# Patient Record
Sex: Female | Born: 1960 | Race: Black or African American | Hispanic: No | Marital: Single | Smoking: Never smoker
Health system: Southern US, Community
[De-identification: ages and names within clinical notes are randomized; demographics above are authoritative.]

## PROBLEM LIST (undated history)

## (undated) ENCOUNTER — Emergency Department (HOSPITAL_BASED_OUTPATIENT_CLINIC_OR_DEPARTMENT_OTHER): Admission: EM | Discharge status: Absent without leave | Payer: Medicare Other

## (undated) DIAGNOSIS — I10 Essential (primary) hypertension: Secondary | ICD-10-CM

## (undated) DIAGNOSIS — E119 Type 2 diabetes mellitus without complications: Secondary | ICD-10-CM

## (undated) DIAGNOSIS — F79 Unspecified intellectual disabilities: Secondary | ICD-10-CM

## (undated) HISTORY — PX: CERVICAL SPINE SURGERY: SHX589

## (undated) HISTORY — PX: ABDOMINAL HYSTERECTOMY: SHX81

## (undated) HISTORY — PX: BACK SURGERY: SHX140

---

## 2014-07-13 ENCOUNTER — Emergency Department (HOSPITAL_BASED_OUTPATIENT_CLINIC_OR_DEPARTMENT_OTHER): Payer: Medicare Other

## 2014-07-13 ENCOUNTER — Emergency Department (HOSPITAL_BASED_OUTPATIENT_CLINIC_OR_DEPARTMENT_OTHER)
Admission: EM | Admit: 2014-07-13 | Discharge: 2014-07-13 | Disposition: A | Discharge status: Home / Self Care | Payer: Medicare Other | Attending: Emergency Medicine | Admitting: Emergency Medicine

## 2014-07-13 ENCOUNTER — Encounter (HOSPITAL_BASED_OUTPATIENT_CLINIC_OR_DEPARTMENT_OTHER): Payer: Self-pay | Admitting: Emergency Medicine

## 2014-07-13 DIAGNOSIS — F79 Unspecified intellectual disabilities: Secondary | ICD-10-CM | POA: Insufficient documentation

## 2014-07-13 DIAGNOSIS — Z79899 Other long term (current) drug therapy: Secondary | ICD-10-CM | POA: Diagnosis not present

## 2014-07-13 DIAGNOSIS — M79609 Pain in unspecified limb: Secondary | ICD-10-CM | POA: Insufficient documentation

## 2014-07-13 DIAGNOSIS — M79604 Pain in right leg: Secondary | ICD-10-CM

## 2014-07-13 DIAGNOSIS — I10 Essential (primary) hypertension: Secondary | ICD-10-CM | POA: Insufficient documentation

## 2014-07-13 DIAGNOSIS — M25669 Stiffness of unspecified knee, not elsewhere classified: Secondary | ICD-10-CM | POA: Diagnosis not present

## 2014-07-13 HISTORY — DX: Unspecified intellectual disabilities: F79

## 2014-07-13 HISTORY — DX: Essential (primary) hypertension: I10

## 2014-07-13 LAB — CBC WITH DIFFERENTIAL/PLATELET
BASOS ABS: 0 10*3/uL (ref 0.0–0.1)
Basophils Relative: 1 % (ref 0–1)
EOS PCT: 1 % (ref 0–5)
Eosinophils Absolute: 0 10*3/uL (ref 0.0–0.7)
HEMATOCRIT: 39.7 % (ref 36.0–46.0)
Hemoglobin: 13.2 g/dL (ref 12.0–15.0)
LYMPHS PCT: 44 % (ref 12–46)
Lymphs Abs: 2.6 10*3/uL (ref 0.7–4.0)
MCH: 27.5 pg (ref 26.0–34.0)
MCHC: 33.2 g/dL (ref 30.0–36.0)
MCV: 82.7 fL (ref 78.0–100.0)
MONO ABS: 0.4 10*3/uL (ref 0.1–1.0)
Monocytes Relative: 7 % (ref 3–12)
Neutro Abs: 2.8 10*3/uL (ref 1.7–7.7)
Neutrophils Relative %: 47 % (ref 43–77)
Platelets: 285 10*3/uL (ref 150–400)
RBC: 4.8 MIL/uL (ref 3.87–5.11)
RDW: 13.2 % (ref 11.5–15.5)
WBC: 5.8 10*3/uL (ref 4.0–10.5)

## 2014-07-13 LAB — COMPREHENSIVE METABOLIC PANEL
ALT: 19 U/L (ref 0–35)
ANION GAP: 11 (ref 5–15)
AST: 16 U/L (ref 0–37)
Albumin: 4 g/dL (ref 3.5–5.2)
Alkaline Phosphatase: 49 U/L (ref 39–117)
BUN: 17 mg/dL (ref 6–23)
CALCIUM: 9.9 mg/dL (ref 8.4–10.5)
CO2: 29 meq/L (ref 19–32)
CREATININE: 1 mg/dL (ref 0.50–1.10)
Chloride: 103 mEq/L (ref 96–112)
GFR calc Af Amer: 73 mL/min — ABNORMAL LOW (ref >90)
GFR calc non Af Amer: 63 mL/min — ABNORMAL LOW (ref >90)
Glucose, Bld: 126 mg/dL — ABNORMAL HIGH (ref 70–99)
Potassium: 3.4 mEq/L — ABNORMAL LOW (ref 3.7–5.3)
Sodium: 143 mEq/L (ref 137–147)
Total Bilirubin: 0.7 mg/dL (ref 0.3–1.2)
Total Protein: 7.3 g/dL (ref 6.0–8.3)

## 2014-07-13 LAB — URINALYSIS, ROUTINE W REFLEX MICROSCOPIC
Bilirubin Urine: NEGATIVE
GLUCOSE, UA: NEGATIVE mg/dL
HGB URINE DIPSTICK: NEGATIVE
KETONES UR: NEGATIVE mg/dL
Leukocytes, UA: NEGATIVE
Nitrite: NEGATIVE
Protein, ur: NEGATIVE mg/dL
Specific Gravity, Urine: 1.023 (ref 1.005–1.030)
Urobilinogen, UA: 1 mg/dL (ref 0.0–1.0)
pH: 6 (ref 5.0–8.0)

## 2014-07-13 MED ORDER — DICLOFENAC SODIUM 75 MG PO TBEC: 75.0000 mg | DELAYED_RELEASE_TABLET | Freq: Two times a day (BID) | ORAL | Status: DC

## 2014-07-13 NOTE — ED Notes (Signed)
Pt returned to treatment room from bathroom with steady gait.

## 2014-07-13 NOTE — Discharge Instructions (Signed)

## 2014-07-13 NOTE — ED Notes (Signed)
Pt gaurdian (her sister) reports that the patient has had right leg pain for 1.5 months. Pain and "stiffness" has become worse

## 2014-07-13 NOTE — ED Provider Notes (Signed)
CSN: [id number]     Arrival date & time 07/13/14  1104 History   First MD Initiated Contact with Patient 07/13/14 1129     Chief Complaint  Patient presents with  . Leg Pain     (Consider location/radiation/quality/duration/timing/severity/associated sxs/prior Treatment) Patient is a 53 y.o. female presenting with leg pain. The history is provided by the patient. No language interpreter was used.  Leg Pain Location:  Leg Injury: no   Leg location:  R leg Pain details:    Quality:  Aching   Radiates to:  Does not radiate   Severity:  Moderate   Timing:  Constant Chronicity:  New Dislocation: no   Relieved by:  Nothing Worsened by:  Nothing tried Ineffective treatments:  None tried Associated symptoms: stiffness   Associated symptoms: no numbness and no swelling   Risk factors: no concern for non-accidental trauma   Pt here with sibling.   Pt has had leg pain for 6 weeks.  Pt has had decreased ability to walk.    Past Medical History  Diagnosis Date  . Hypertension   . Mental retardation    Past Surgical History  Procedure Laterality Date  . Abdominal hysterectomy     No family history on file. History  Substance Use Topics  . Smoking status: Never Smoker   . Smokeless tobacco: Not on file  . Alcohol Use: No   OB History   Grav Para Term Preterm Abortions TAB SAB Ect Mult Living                 Review of Systems  Musculoskeletal: Positive for stiffness.  All other systems reviewed and are negative.     Allergies  Review of patient's allergies indicates no known allergies.  Home Medications   Prior to Admission medications   Medication Sig Start Date End Date Taking? Authorizing Provider  ARIPiprazole (ABILIFY PO) Take by mouth.   Yes Historical Provider, MD  LORazepam (ATIVAN PO) Take by mouth.   Yes Historical Provider, MD  PARoxetine (PAXIL) 40 MG tablet Take 40 mg by mouth every morning.   Yes Historical Provider, MD   triamterene-hydrochlorothiazide (MAXZIDE-25) 37.5-25 MG per tablet Take 1 tablet by mouth daily.   Yes Historical Provider, MD   BP 137/104  Pulse 87  Temp(Src) 99.3 F (37.4 C) (Oral)  Resp 18  Ht 5\' 2"  (1.575 m)  Wt 176 lb (79.833 kg)  BMI 32.18 kg/m2  SpO2 98% Physical Exam  Nursing note and vitals reviewed. Constitutional: She is oriented to person, place, and time. She appears well-developed and well-nourished.  HENT:  Head: Normocephalic and atraumatic.  Eyes: EOM are normal. Pupils are equal, round, and reactive to light.  Neck: Normal range of motion.  Cardiovascular: Normal rate.   Pulmonary/Chest: Effort normal.  Abdominal: Soft. She exhibits no distension.  Musculoskeletal: She exhibits no edema and no tenderness.  No swelling, no deformity from,  Gait appears normal currently  Neurological: She is alert and oriented to person, place, and time.  Skin: No rash noted. No erythema.  Psychiatric: She has a normal mood and affect.    ED Course  Procedures (including critical care time) Labs Review Labs Reviewed - No data to display  Imaging Review No results found.   EKG Interpretation None      MDM   Final diagnoses:  Pain of right lower extremity     Sibling concerned about pt having had a stroke.   Ct scan obtained,  No  acute abnormality,  Labs no acute.  Sister concerned about other neurologic disorders including MS.   Pt appears well no sign of acute injury or illness.  I will treat with voltaren.   I advised if symptoms persist,  Pt should see her primary for referral to neurology for further  Evaluation.    Lonia SkinnerLeslie K CashtonSofia, PA-C 07/13/14 1416

## 2014-07-15 NOTE — ED Provider Notes (Signed)
Medical screening examination/treatment/procedure(s) were performed by non-physician practitioner and as supervising physician I was immediately available for consultation/collaboration.   EKG Interpretation None        Vanetta MuldersScott Creighton Longley, MD 07/15/14 1507

## 2014-12-30 ENCOUNTER — Emergency Department (HOSPITAL_BASED_OUTPATIENT_CLINIC_OR_DEPARTMENT_OTHER): Payer: Medicare Other

## 2014-12-30 ENCOUNTER — Encounter (HOSPITAL_BASED_OUTPATIENT_CLINIC_OR_DEPARTMENT_OTHER): Payer: Self-pay | Admitting: *Deleted

## 2014-12-30 ENCOUNTER — Emergency Department (HOSPITAL_BASED_OUTPATIENT_CLINIC_OR_DEPARTMENT_OTHER)
Admission: EM | Admit: 2014-12-30 | Discharge: 2014-12-30 | Disposition: A | Discharge status: Home / Self Care | Payer: Medicare Other | Attending: Emergency Medicine | Admitting: Emergency Medicine

## 2014-12-30 DIAGNOSIS — I10 Essential (primary) hypertension: Secondary | ICD-10-CM | POA: Insufficient documentation

## 2014-12-30 DIAGNOSIS — Y998 Other external cause status: Secondary | ICD-10-CM | POA: Diagnosis not present

## 2014-12-30 DIAGNOSIS — Y9389 Activity, other specified: Secondary | ICD-10-CM | POA: Diagnosis not present

## 2014-12-30 DIAGNOSIS — Z791 Long term (current) use of non-steroidal anti-inflammatories (NSAID): Secondary | ICD-10-CM | POA: Insufficient documentation

## 2014-12-30 DIAGNOSIS — X58XXXA Exposure to other specified factors, initial encounter: Secondary | ICD-10-CM | POA: Insufficient documentation

## 2014-12-30 DIAGNOSIS — S6991XA Unspecified injury of right wrist, hand and finger(s), initial encounter: Secondary | ICD-10-CM | POA: Diagnosis not present

## 2014-12-30 DIAGNOSIS — M25531 Pain in right wrist: Secondary | ICD-10-CM

## 2014-12-30 DIAGNOSIS — Z8659 Personal history of other mental and behavioral disorders: Secondary | ICD-10-CM | POA: Diagnosis not present

## 2014-12-30 DIAGNOSIS — Y9289 Other specified places as the place of occurrence of the external cause: Secondary | ICD-10-CM | POA: Insufficient documentation

## 2014-12-30 DIAGNOSIS — Z79899 Other long term (current) drug therapy: Secondary | ICD-10-CM | POA: Diagnosis not present

## 2014-12-30 MED ORDER — IBUPROFEN 800 MG PO TABS
800.0000 mg | ORAL_TABLET | Freq: Once | ORAL | Status: AC
  Administered 2014-12-30: 800 mg via ORAL
  Filled 2014-12-30: qty 1

## 2014-12-30 MED ORDER — IBUPROFEN 800 MG PO TABS: 800.0000 mg | ORAL_TABLET | Freq: Three times a day (TID) | ORAL | Status: DC

## 2014-12-30 NOTE — Discharge Instructions (Signed)
Return to the ED with any concerns including increased pain, swelling/numbness/discoloration of fingers or hand, or any other alarming symptoms °

## 2014-12-30 NOTE — ED Provider Notes (Signed)
CSN: [id number]     Arrival date & time 12/30/14  1452 History   First MD Initiated Contact with Patient 12/30/14 1516     Chief Complaint  Patient presents with  . Wrist Pain     (Consider location/radiation/quality/duration/timing/severity/associated sxs/prior Treatment) HPI  Pt presenting with c/o right wrist pain.  Pt has no known injury.  She has hx of MR, [facility] a [facility] and staff noted that her right wrist appears swollen.  Gaurdian noted that with certain movements she seemed to be having pain in wrist. Unsure whether she had a fall or hit her wrist against something.  No redness.  No fever.  Pt has not had any treatment prior to arrival.  There are no other associated systemic symptoms, there are no other alleviating or modifying factors.   Past Medical History  Diagnosis Date  . Hypertension   . Mental retardation    Past Surgical History  Procedure Laterality Date  . Abdominal hysterectomy    . Back surgery     No family history on file. History  Substance Use Topics  . Smoking status: Never Smoker   . Smokeless tobacco: Never Used  . Alcohol Use: No   OB History    No data available     Review of Systems  ROS reviewed and all otherwise negative except for mentioned in HPI    Allergies  Review of patient's allergies indicates no known allergies.  Home Medications   Prior to Admission medications   Medication Sig Start Date End Date Taking? Authorizing Provider  PARoxetine (PAXIL) 40 MG tablet Take 40 mg by mouth every morning.   Yes Historical Provider, MD  triamterene-hydrochlorothiazide (MAXZIDE-25) 37.5-25 MG per tablet Take 1 tablet by mouth daily.   Yes Historical Provider, MD  ARIPiprazole (ABILIFY PO) Take 25 mg by mouth daily.     Historical Provider, MD  diclofenac (VOLTAREN) 75 MG EC tablet Take 1 tablet (75 mg total) by mouth 2 (two) times daily. 07/13/14   Elson Areas, PA-C  ibuprofen (ADVIL,MOTRIN) 800 MG tablet Take 1 tablet (800  mg total) by mouth 3 (three) times daily. 12/30/14   Ethelda Chick, MD  LORazepam (ATIVAN PO) Take 1 mg by mouth 2 (two) times daily as needed.     Historical Provider, MD   BP 140/83 mmHg  Pulse 85  Temp(Src) 98.8 F (37.1 C) (Oral)  Resp 16  Ht  (1.575 m)  Wt 177 lb (80.287 kg)  BMI 32.37 kg/m2  SpO2 100%  Vitals reviewed Physical Exam  Physical Examination: General appearance - alert, well appearing, and in no distress Mental status - alert, oriented to person, place, and time Eyes - no conjunctival injection, no scleral icterus Neurological - alert, no focal findings or movement disorder noted, moving all extremities, strength 5/5 in right upper extremity Musculoskeletal - mild swelling overlying dorsum of right wrist, mild ttp over dorsum of right wrist, no snuffbox tenderness, otherwise no joint tenderness, deformity or swelling Extremities - peripheral pulses normal, no pedal edema, no clubbing or cyanosis Skin - no overlying erythema or warmth of skin of wrist, normal coloration and turgor, no rashes  ED Course  Procedures (including critical care time) Labs Review Labs Reviewed - No data to display  Imaging Review Dg Wrist Complete Right  12/30/2014   CLINICAL DATA:  54 year old female with history of trauma from a fall complaining of right-sided wrist pain and swelling.  EXAM: RIGHT WRIST - COMPLETE  3+ VIEW  COMPARISON:  No priors.  FINDINGS: Four views of the right wrist demonstrate no acute displaced fracture. No dislocation or subluxation. Multifocal joint space narrowing, subchondral sclerosis, subchondral cyst formation and osteophyte formation is noted, compatible with osteoarthritis.  IMPRESSION: 1. No acute radiographic abnormality of the right wrist. 2. Osteoarthritis, as above.   Electronically Signed   By: Trudie Reedaniel  Entrikin M.D.   On: 12/30/2014 15:45     EKG Interpretation None      MDM   Final diagnoses:  Wrist pain, acute, right    Pt presenting  with c/o right wrist pain and swelling.  Xray reassuring, unknown if there was any injury.  Wrist splint applied.  Advised RICE protocol, given ibuprofen for discomfort.  Discharged with strict return precautions.  Pt agreeable with plan.    Ethelda ChickMartha K Linker, MD 12/30/14 [phone]

## 2014-12-30 NOTE — ED Notes (Addendum)
Pt has MR- was at [facility] today and c/o of wrist swelling- unknown if there was an injury- pt's sister/guardian is here with her- pulse present

## 2015-02-24 ENCOUNTER — Emergency Department (HOSPITAL_BASED_OUTPATIENT_CLINIC_OR_DEPARTMENT_OTHER)
Admission: EM | Admit: 2015-02-24 | Discharge: 2015-02-24 | Disposition: A | Discharge status: Home / Self Care | Payer: Medicare Other | Attending: Emergency Medicine | Admitting: Emergency Medicine

## 2015-02-24 ENCOUNTER — Encounter (HOSPITAL_BASED_OUTPATIENT_CLINIC_OR_DEPARTMENT_OTHER): Payer: Self-pay | Admitting: *Deleted

## 2015-02-24 DIAGNOSIS — M79604 Pain in right leg: Secondary | ICD-10-CM | POA: Insufficient documentation

## 2015-02-24 DIAGNOSIS — I10 Essential (primary) hypertension: Secondary | ICD-10-CM | POA: Diagnosis not present

## 2015-02-24 DIAGNOSIS — Z791 Long term (current) use of non-steroidal anti-inflammatories (NSAID): Secondary | ICD-10-CM | POA: Insufficient documentation

## 2015-02-24 DIAGNOSIS — F79 Unspecified intellectual disabilities: Secondary | ICD-10-CM | POA: Insufficient documentation

## 2015-02-24 DIAGNOSIS — R269 Unspecified abnormalities of gait and mobility: Secondary | ICD-10-CM | POA: Diagnosis present

## 2015-02-24 DIAGNOSIS — Z9889 Other specified postprocedural states: Secondary | ICD-10-CM | POA: Diagnosis not present

## 2015-02-24 DIAGNOSIS — Z79899 Other long term (current) drug therapy: Secondary | ICD-10-CM | POA: Diagnosis not present

## 2015-02-24 MED ORDER — NAPROXEN 500 MG PO TABS: 500.0000 mg | ORAL_TABLET | Freq: Two times a day (BID) | ORAL | Status: DC

## 2015-02-24 NOTE — ED Notes (Signed)
Pt's sister at bedside is pt's caregiver- expresses concern that pt's gait has changed in the last 2 weeks- pt having more difficulty going from sitting to standing and having trouble with strength in her hands- pt had neck surgery in sept

## 2015-02-24 NOTE — ED Notes (Signed)
PA at bedside.

## 2015-02-24 NOTE — Discharge Instructions (Signed)
You were evaluated in the ED today for your leg discomfort and stiffness. There does not appear to be an emergent cause for your symptoms at this time. You reported feeling better while being in the ED. It is important for you to follow-up with her primary care for further evaluation and management of your symptoms. Return to ED for new or worsening symptoms.

## 2015-02-24 NOTE — ED Provider Notes (Signed)
CSN: [id number]     Arrival date & time 02/24/15  1042 History   First MD Initiated Contact with Patient 02/24/15 1216     Chief Complaint  Patient presents with  . Gait Problem   Level V caveat: MR  (Consider location/radiation/quality/duration/timing/severity/associated sxs/prior Treatment) HPI Jocelyn Simmons is a 54 y.o. female with history of MR who comes in for evaluation of leg discomfort and gait change. History of present illness is given by patient's sister/guardian and caregiver. Sister states that patient has had decreased grasp strength in both of her hands since her neck surgery in September 2015. She also reports a discomfort in her right upper leg. They've not tried anything to improve the symptoms. Reports they will go back to Dr. Petra KubaKilpatrick who performed neck surgery for further evaluation. She reports patient has difficulties getting up and sitting down that has been a problem since the neck surgery. They deny any acute changes. No acute injuries. Patient is at her baseline according to family members in the room. They deny any fevers at home, urinary or fecal incontinence, chest pain, shortness of breath, abdominal pain, nausea or vomiting, numbness, syncope, rash.  Past Medical History  Diagnosis Date  . Hypertension   . Mental retardation    Past Surgical History  Procedure Laterality Date  . Abdominal hysterectomy    . Back surgery    . Cervical spine surgery     No family history on file. History  Substance Use Topics  . Smoking status: Never Smoker   . Smokeless tobacco: Never Used  . Alcohol Use: No   OB History    No data available     Review of Systems A 10 point review of systems was completed and was negative except for pertinent positives and negatives as mentioned in the history of present illness     Allergies  Review of patient's allergies indicates no known allergies.  Home Medications   Prior to Admission medications   Medication Sig  Start Date End Date Taking? Authorizing Provider  ARIPiprazole (ABILIFY PO) Take 25 mg by mouth daily.    Yes Historical Provider, MD  busPIRone (BUSPAR) 10 MG tablet Take 10 mg by mouth 2 (two) times daily.   Yes Historical Provider, MD  LORazepam (ATIVAN PO) Take 1 mg by mouth 2 (two) times daily as needed.    Yes Historical Provider, MD  PARoxetine (PAXIL) 40 MG tablet Take 40 mg by mouth every morning.   Yes Historical Provider, MD  traZODone (DESYREL) 50 MG tablet Take 25 mg by mouth at bedtime.   Yes Historical Provider, MD  triamterene-hydrochlorothiazide (MAXZIDE-25) 37.5-25 MG per tablet Take 1 tablet by mouth daily.   Yes Historical Provider, MD  diclofenac (VOLTAREN) 75 MG EC tablet Take 1 tablet (75 mg total) by mouth 2 (two) times daily. 07/13/14   Elson AreasLeslie K Sofia, PA-C  ibuprofen (ADVIL,MOTRIN) 800 MG tablet Take 1 tablet (800 mg total) by mouth 3 (three) times daily. 12/30/14   Jerelyn ScottMartha Linker, MD  naproxen (NAPROSYN) 500 MG tablet Take 1 tablet (500 mg total) by mouth 2 (two) times daily. 02/24/15   Kamarii Carton, PA-C   BP 144/80 mmHg  Pulse 95  Temp(Src) 97.7 F (36.5 C) (Oral)  Resp 18  SpO2 97% Physical Exam  Constitutional: She is oriented to person, place, and time. She appears well-developed and well-nourished.  Patient with MR. Can only understand basic commands  HENT:  Head: Normocephalic and atraumatic.  Mouth/Throat: Oropharynx is clear  and moist.  Eyes: Conjunctivae are normal. Pupils are equal, round, and reactive to light. Right eye exhibits no discharge. Left eye exhibits no discharge. No scleral icterus.  Neck: Neck supple.  Cardiovascular: Normal rate, regular rhythm and normal heart sounds.   Pulmonary/Chest: Effort normal and breath sounds normal. No respiratory distress. She has no wheezes. She has no rales.  Abdominal: Soft. There is no tenderness.  Musculoskeletal: She exhibits no tenderness.  No tenderness noted to right lower extremity. No obvious  lesions, deformities or crepitus. Patient makes no sign of discomfort with palpation or range of motion. Patient maintains full active range of motion of bilateral lower extremities. No discomfort noted with palpation of pelvis. Distal pulses intact with brisk cap refill.  Neurological: She is alert and oriented to person, place, and time.  Cranial Nerves II-XII grossly intact. Moves all extremities without ataxia, motor and sensation appear baseline for patient. Grip strengths are intact and equal bilaterally. Gait appears baseline without ataxia or antalgia. Patient is able to get up from sitting without difficulty. No evidence of acute pathology.  Skin: Skin is warm and dry. No rash noted.  Psychiatric: She has a normal mood and affect.  Nursing note and vitals reviewed.   ED Course  Procedures (including critical care time) Labs Review Labs Reviewed - No data to display  Imaging Review No results found.   EKG Interpretation None     Meds given in ED:  Medications - No data to display  Discharge Medication List as of 02/24/2015 12:42 PM    START taking these medications   Details  naproxen (NAPROSYN) 500 MG tablet Take 1 tablet (500 mg total) by mouth 2 (two) times daily., Starting 02/24/2015, Until Discontinued, Print       Filed Vitals:   02/24/15 1056 02/24/15 1247  BP: 132/95 144/80  Pulse: 103 95  Temp: 98.4 F (36.9 C) 97.7 F (36.5 C)  TempSrc: Oral Oral  Resp: 18 18  SpO2: 100% 97%    MDM  Vitals stable - WNL -afebrile, no tachycardia on my exam. Heart rate in 90s. Pt resting comfortably in ED. PE-grossly benign physical exam. Patient maintains full active range of motion of pelvis and bilateral lower extremities. Neuro exam is baseline for patient. No evidence of vascular/neuro compromise. No evidence of trauma or injury. Family/guardian report patient is doing much better since arriving in ED.  No evidence of acute or emergent pathology at this time. We  will treat leg pain conservatively with anti-inflammatories. Discussed follow-up with patient's primary care for further evaluation and management of her symptoms.  I discussed all relevant lab findings and imaging results with pt and they verbalized understanding. Discussed f/u with PCP within 48 hrs and return precautions, pt very amenable to plan.  Final diagnoses:  Right leg pain        Joycie Peek, PA-C 02/24/15 1539  Vanetta Mulders, MD 03/02/15 0003

## 2015-07-02 ENCOUNTER — Emergency Department (HOSPITAL_BASED_OUTPATIENT_CLINIC_OR_DEPARTMENT_OTHER)
Admission: EM | Admit: 2015-07-02 | Discharge: 2015-07-02 | Disposition: A | Discharge status: Home / Self Care | Payer: Medicare Other | Attending: Emergency Medicine | Admitting: Emergency Medicine

## 2015-07-02 ENCOUNTER — Encounter (HOSPITAL_BASED_OUTPATIENT_CLINIC_OR_DEPARTMENT_OTHER): Payer: Self-pay | Admitting: *Deleted

## 2015-07-02 DIAGNOSIS — K0889 Other specified disorders of teeth and supporting structures: Secondary | ICD-10-CM

## 2015-07-02 DIAGNOSIS — K0381 Cracked tooth: Secondary | ICD-10-CM | POA: Insufficient documentation

## 2015-07-02 DIAGNOSIS — Z79899 Other long term (current) drug therapy: Secondary | ICD-10-CM | POA: Diagnosis not present

## 2015-07-02 DIAGNOSIS — F79 Unspecified intellectual disabilities: Secondary | ICD-10-CM | POA: Insufficient documentation

## 2015-07-02 DIAGNOSIS — K047 Periapical abscess without sinus: Secondary | ICD-10-CM | POA: Diagnosis not present

## 2015-07-02 DIAGNOSIS — I10 Essential (primary) hypertension: Secondary | ICD-10-CM | POA: Diagnosis not present

## 2015-07-02 DIAGNOSIS — K088 Other specified disorders of teeth and supporting structures: Secondary | ICD-10-CM | POA: Diagnosis not present

## 2015-07-02 MED ORDER — PENICILLIN V POTASSIUM 500 MG PO TABS: 500.0000 mg | ORAL_TABLET | Freq: Four times a day (QID) | ORAL | Status: AC

## 2015-07-02 MED ORDER — PENICILLIN V POTASSIUM 250 MG PO TABS
500.0000 mg | ORAL_TABLET | Freq: Once | ORAL | Status: AC
  Administered 2015-07-02: 500 mg via ORAL
  Filled 2015-07-02: qty 2

## 2015-07-02 MED ORDER — IBUPROFEN 400 MG PO TABS
600.0000 mg | ORAL_TABLET | Freq: Once | ORAL | Status: AC
  Administered 2015-07-02: 600 mg via ORAL
  Filled 2015-07-02 (×2): qty 1

## 2015-07-02 NOTE — Discharge Instructions (Signed)
Return without fail for worsening symptoms, including fever, worsening pain, worsening swelling, changes in her voice, difficulty swallowing or handling her secretions, or any other symptoms concerning to you. Please take antibiotics as prescribed and please have a dental appointment arranged first thing Monday morning.  Abscessed Tooth An abscessed tooth is an infection around your tooth. It may be caused by holes or damage to the tooth (cavity) or a dental disease. An abscessed tooth causes mild to very bad pain in and around the tooth. See your dentist right away if you have tooth or gum pain. HOME CARE  Take your medicine as told. Finish it even if you start to feel better.  Do not drive after taking pain medicine.  Rinse your mouth (gargle) often with salt water ( teaspoon salt in 8 ounces of warm water).  Do not apply heat to the outside of your face. GET HELP RIGHT AWAY IF:   You have a temperature by mouth above 102 F (38.9 C), not controlled by medicine.  You have chills and a very bad headache.  You have problems breathing or swallowing.  Your mouth will not open.  You develop puffiness (swelling) on the neck or around the eye.  Your pain is not helped by medicine.  Your pain is getting worse instead of better. MAKE SURE YOU:   Understand these instructions.  Will watch your condition.  Will get help right away if you are not doing well or get worse. Document Released: 05/01/2008 Document Revised: 02/05/2012 Document Reviewed: 02/21/2011 [facility] Patient Information 2015 [place], [place]. This information is not intended to replace advice given to you by your health care provider. Make sure you discuss any questions you have with your health care provider.

## 2015-07-02 NOTE — ED Provider Notes (Signed)
CSN: [id number]     Arrival date & time 07/02/15  1725 History   First MD Initiated Contact with Patient 07/02/15 1818     No chief complaint on file.    (Consider location/radiation/quality/duration/timing/severity/associated sxs/prior Treatment) HPI 54 year old female with history of cognitive impairment and hypertension who presents to the emergency department with dental pain. She began to complain of dental pain for 1-2 days. She was brought in by her caregiver is they've noticed some mild swelling associated with her tooth pain. They deny any fever, chills, nausea, vomiting, voice changes, difficulty swallowing, neck swelling, or difficulty handling secretions. Past Medical History  Diagnosis Date  . Hypertension   . Mental retardation    Past Surgical History  Procedure Laterality Date  . Abdominal hysterectomy    . Back surgery    . Cervical spine surgery     History reviewed. No pertinent family history. History  Substance Use Topics  . Smoking status: Never Smoker   . Smokeless tobacco: Never Used  . Alcohol Use: No   OB History    No data available     Review of Systems  Constitutional: Negative for fever, chills and diaphoresis.  HENT: Positive for dental problem and facial swelling. Negative for drooling, ear pain, rhinorrhea, sore throat, trouble swallowing and voice change.   Respiratory: Negative for shortness of breath.   Cardiovascular: Negative for chest pain.  Gastrointestinal: Negative for nausea and vomiting.  Musculoskeletal: Negative for neck pain.  Skin: Negative for wound.  Allergic/Immunologic: Negative for immunocompromised state.  Neurological: Negative for headaches.  Psychiatric/Behavioral: Negative for confusion.      Allergies  Review of patient's allergies indicates no known allergies.  Home Medications   Prior to Admission medications   Medication Sig Start Date End Date Taking? Authorizing Provider  ARIPiprazole (ABILIFY PO)  Take 25 mg by mouth daily.    Yes Historical Provider, MD  busPIRone (BUSPAR) 10 MG tablet Take 10 mg by mouth 2 (two) times daily.   Yes Historical Provider, MD  LORazepam (ATIVAN PO) Take 1 mg by mouth 2 (two) times daily as needed.    Yes Historical Provider, MD  PARoxetine (PAXIL) 40 MG tablet Take 40 mg by mouth every morning.   Yes Historical Provider, MD  traZODone (DESYREL) 50 MG tablet Take 25 mg by mouth at bedtime.   Yes Historical Provider, MD  penicillin v potassium (VEETID) 500 MG tablet Take 1 tablet (500 mg total) by mouth 4 (four) times daily. 07/02/15 07/15/15  Lavera Guise, MD   BP 135/95 mmHg  Pulse 119  Temp(Src) 97.5 F (36.4 C) (Oral)  Resp 18  Ht  (1.6 m)  Wt 167 lb (75.751 kg)  BMI 29.59 kg/m2  SpO2 97% Physical Exam  Nursing note and vitals reviewed.  Physical Exam  Constitutional: Well developed, well nourished, non-toxic, and in no acute distress Head: Normocephalic and atraumatic.  Mouth/Throat: Poor dentition. There is fractured tooth #30 of the right lower mandible, with some gingival erythema and soft tissue swelling involving the right lower mandible adjacent to the tooth. No appreciate abscess. Tender to percussion of tooth. Neck: Normal range of motion. Neck supple.  Cardiovascular: Normal rate and regular rhythm.   Pulmonary/Chest: Effort normal. Abdominal: Soft. There is no tenderness. There is no rebound and no guarding.  Musculoskeletal: Normal range of motion.  Neurological: Alert, no facial droop, moves all extremities symmetrically Skin: Skin is warm and dry.  Psychiatric: Cooperative   ED Course  Procedures (including critical care time) Labs Review Labs Reviewed - No data to display  Imaging Review No results found.   EKG Interpretation None      MDM   Final diagnoses:  Dental abscess  Pain, dental    54 year old female who present with dental pain. She is non-toxic and in no acute distress. She has poor dentition on  exam with tenderness to percussion of a fractured tooth 30 with local soft tissue swelling suggestive of dental infection. No drainable abscess identified. No concern for deep soft tissue infection of neck. Given pencilliin for treatment and ibuprofen for pain. Written for prescription for pencillin. Discussion with caregiver regarding follow-up with dentistry on Monday for re-evaluation. Strict return instructions also reviewed with the patients caregiver.     Lavera Guise, MD 07/03/15 1010

## 2015-07-02 NOTE — ED Notes (Signed)
C/o pain in lower left tooth. Swelling noted.

## 2015-07-03 ENCOUNTER — Encounter (HOSPITAL_BASED_OUTPATIENT_CLINIC_OR_DEPARTMENT_OTHER): Payer: Self-pay | Admitting: Emergency Medicine

## 2015-09-26 ENCOUNTER — Encounter (HOSPITAL_BASED_OUTPATIENT_CLINIC_OR_DEPARTMENT_OTHER): Payer: Self-pay | Admitting: Emergency Medicine

## 2015-09-26 ENCOUNTER — Emergency Department (HOSPITAL_BASED_OUTPATIENT_CLINIC_OR_DEPARTMENT_OTHER): Payer: Medicare Other

## 2015-09-26 ENCOUNTER — Emergency Department (HOSPITAL_BASED_OUTPATIENT_CLINIC_OR_DEPARTMENT_OTHER)
Admission: EM | Admit: 2015-09-26 | Discharge: 2015-09-26 | Disposition: A | Discharge status: Home / Self Care | Payer: Medicare Other | Attending: Emergency Medicine | Admitting: Emergency Medicine

## 2015-09-26 DIAGNOSIS — Z79899 Other long term (current) drug therapy: Secondary | ICD-10-CM | POA: Insufficient documentation

## 2015-09-26 DIAGNOSIS — J069 Acute upper respiratory infection, unspecified: Secondary | ICD-10-CM | POA: Insufficient documentation

## 2015-09-26 DIAGNOSIS — F79 Unspecified intellectual disabilities: Secondary | ICD-10-CM | POA: Insufficient documentation

## 2015-09-26 DIAGNOSIS — I10 Essential (primary) hypertension: Secondary | ICD-10-CM | POA: Insufficient documentation

## 2015-09-26 DIAGNOSIS — R05 Cough: Secondary | ICD-10-CM | POA: Diagnosis not present

## 2015-09-26 MED ORDER — AZITHROMYCIN 250 MG PO TABS
ORAL_TABLET | ORAL | Status: DC
Start: 2015-09-26 — End: 2016-08-21

## 2015-09-26 NOTE — Discharge Instructions (Signed)
Over-the-counter medications as needed for symptom relief.  If you are not improving in the next 2 days, fill the prescription for Zithromax you have been provided.   Upper Respiratory Infection, Adult Most upper respiratory infections (URIs) are a viral infection of the air passages leading to the lungs. A URI affects the nose, throat, and upper air passages. The most common type of URI is nasopharyngitis and is typically referred to as "the common cold." URIs run their course and usually go away on their own. Most of the time, a URI does not require medical attention, but sometimes a bacterial infection in the upper airways can follow a viral infection. This is called a secondary infection. Sinus and middle ear infections are common types of secondary upper respiratory infections. Bacterial pneumonia can also complicate a URI. A URI can worsen asthma and chronic obstructive pulmonary disease (COPD). Sometimes, these complications can require emergency medical care and may be life threatening.  CAUSES Almost all URIs are caused by viruses. A virus is a type of germ and can spread from one person to another.  RISKS FACTORS You may be at risk for a URI if:   You smoke.   You have chronic heart or lung disease.  You have a weakened defense (immune) system.   You are very young or very old.   You have nasal allergies or asthma.  You work in crowded or poorly ventilated areas.  You work in health care facilities or schools. SIGNS AND SYMPTOMS  Symptoms typically develop 2-3 days after you come in contact with a cold virus. Most viral URIs last 7-10 days. However, viral URIs from the influenza virus (flu virus) can last 14-18 days and are typically more severe. Symptoms may include:   Runny or stuffy (congested) nose.   Sneezing.   Cough.   Sore throat.   Headache.   Fatigue.   Fever.   Loss of appetite.   Pain in your forehead, behind your eyes, and over your  cheekbones (sinus pain).  Muscle aches.  DIAGNOSIS  Your health care provider may diagnose a URI by:  Physical exam.  Tests to check that your symptoms are not due to another condition such as:  Strep throat.  Sinusitis.  Pneumonia.  Asthma. TREATMENT  A URI goes away on its own with time. It cannot be cured with medicines, but medicines may be prescribed or recommended to relieve symptoms. Medicines may help:  Reduce your fever.  Reduce your cough.  Relieve nasal congestion. HOME CARE INSTRUCTIONS   Take medicines only as directed by your health care provider.   Gargle warm saltwater or take cough drops to comfort your throat as directed by your health care provider.  Use a warm mist humidifier or inhale steam from a shower to increase air moisture. This may make it easier to breathe.  Drink enough fluid to keep your urine clear or pale yellow.   Eat soups and other clear broths and maintain good nutrition.   Rest as needed.   Return to work when your temperature has returned to normal or as your health care provider advises. You may need to stay home longer to avoid infecting others. You can also use a face mask and careful hand washing to prevent spread of the virus.  Increase the usage of your inhaler if you have asthma.   Do not use any tobacco products, including cigarettes, chewing tobacco, or electronic cigarettes. If you need help quitting, ask your health care  provider. PREVENTION  The best way to protect yourself from getting a cold is to practice good hygiene.   Avoid oral or hand contact with people with cold symptoms.   Wash your hands often if contact occurs.  There is no clear evidence that vitamin C, vitamin E, echinacea, or exercise reduces the chance of developing a cold. However, it is always recommended to get plenty of rest, exercise, and practice good nutrition.  SEEK MEDICAL CARE IF:   You are getting worse rather than better.    Your symptoms are not controlled by medicine.   You have chills.  You have worsening shortness of breath.  You have brown or red mucus.  You have yellow or brown nasal discharge.  You have pain in your face, especially when you bend forward.  You have a fever.  You have swollen neck glands.  You have pain while swallowing.  You have white areas in the back of your throat. SEEK IMMEDIATE MEDICAL CARE IF:   You have severe or persistent:  Headache.  Ear pain.  Sinus pain.  Chest pain.  You have chronic lung disease and any of the following:  Wheezing.  Prolonged cough.  Coughing up blood.  A change in your usual mucus.  You have a stiff neck.  You have changes in your:  Vision.  Hearing.  Thinking.  Mood. MAKE SURE YOU:   Understand these instructions.  Will watch your condition.  Will get help right away if you are not doing well or get worse.   This information is not intended to replace advice given to you by your health care provider. Make sure you discuss any questions you have with your health care provider.   Document Released: 05/09/2001 Document Revised: 03/30/2015 Document Reviewed: 02/18/2014 Elsevier Interactive Patient Education [employer].

## 2015-09-26 NOTE — ED Notes (Signed)
Patient's sister who is the patients guarding reports that the patient's cough has become progressively worse over the last few weeks.

## 2015-09-26 NOTE — ED Provider Notes (Signed)
CSN: [id number]     Arrival date & time 09/26/15  0358 History   First MD Initiated Contact with Patient 09/26/15 [phone]     Chief Complaint  Patient presents with  . Cough     (Consider location/radiation/quality/duration/timing/severity/associated sxs/prior Treatment) HPI Comments: Patient is a 54 year old female with history of mental retardation, hypertension. She presents for evaluation of cough for the past 3 days. According to her sister who is her legal guardian, has worsened. Her cough is productive of a yellow sputum. There is no chest pain and no shortness of breath.  Patient is a 54 y.o. female presenting with cough. The history is provided by the patient.  Cough Cough characteristics:  Productive Sputum characteristics:  Yellow Severity:  Moderate Onset quality:  Gradual Duration:  3 days Timing:  Constant Progression:  Worsening Chronicity:  New Smoker: no   Relieved by:  Nothing Worsened by:  Nothing tried Ineffective treatments:  None tried Associated symptoms: no chills and no fever     Past Medical History  Diagnosis Date  . Hypertension   . Mental retardation    Past Surgical History  Procedure Laterality Date  . Abdominal hysterectomy    . Back surgery    . Cervical spine surgery     History reviewed. No pertinent family history. Social History  Substance Use Topics  . Smoking status: Never Smoker   . Smokeless tobacco: Never Used  . Alcohol Use: No   OB History    No data available     Review of Systems  Constitutional: Negative for fever and chills.  Respiratory: Positive for cough.   All other systems reviewed and are negative.     Allergies  Review of patient's allergies indicates no known allergies.  Home Medications   Prior to Admission medications   Medication Sig Start Date End Date Taking? Authorizing Provider  ARIPiprazole (ABILIFY PO) Take 25 mg by mouth daily.     Historical Provider, MD  busPIRone (BUSPAR) 10 MG tablet  Take 10 mg by mouth 2 (two) times daily.    Historical Provider, MD  LORazepam (ATIVAN PO) Take 1 mg by mouth 2 (two) times daily as needed.     Historical Provider, MD  PARoxetine (PAXIL) 40 MG tablet Take 40 mg by mouth every morning.    Historical Provider, MD  traZODone (DESYREL) 50 MG tablet Take 25 mg by mouth at bedtime.    Historical Provider, MD   BP 136/93 mmHg  Pulse 103  Temp(Src) 98.7 F (37.1 C) (Oral)  Resp 20  Ht  (1.575 m)  Wt 180 lb (81.647 kg)  BMI 32.91 kg/m2  SpO2 96% Physical Exam  Constitutional: She is oriented to person, place, and time. She appears well-developed and well-nourished. No distress.  HENT:  Head: Normocephalic and atraumatic.  Neck: Normal range of motion. Neck supple.  Cardiovascular: Normal rate and regular rhythm.  Exam reveals no gallop and no friction rub.   No murmur heard. Pulmonary/Chest: Effort normal and breath sounds normal. No respiratory distress. She has no wheezes.  Abdominal: Soft. Bowel sounds are normal. She exhibits no distension. There is no tenderness.  Musculoskeletal: Normal range of motion.  Neurological: She is alert and oriented to person, place, and time.  Skin: Skin is warm and dry. She is not diaphoretic.  Nursing note and vitals reviewed.   ED Course  Procedures (including critical care time) Labs Review Labs Reviewed - No data to display  Imaging Review No results  found. I have personally reviewed and evaluated these images and lab results as part of my medical decision-making.   EKG Interpretation None      MDM   Final diagnoses:  None    Symptoms are most likely viral in nature. Due to her medical history, I will prescribe Zithromax which she is to fill if not improving in the next 48 hours. She is to try over-the-counter medications until that time.    Geoffery Lyonsouglas Gurveer Colucci, MD 09/26/15 [phone]

## 2015-12-10 DIAGNOSIS — M479 Spondylosis, unspecified: Secondary | ICD-10-CM | POA: Diagnosis not present

## 2015-12-10 DIAGNOSIS — M4802 Spinal stenosis, cervical region: Secondary | ICD-10-CM | POA: Diagnosis not present

## 2015-12-10 DIAGNOSIS — M4312 Spondylolisthesis, cervical region: Secondary | ICD-10-CM | POA: Diagnosis not present

## 2015-12-10 DIAGNOSIS — R609 Edema, unspecified: Secondary | ICD-10-CM | POA: Diagnosis not present

## 2015-12-10 DIAGNOSIS — G959 Disease of spinal cord, unspecified: Secondary | ICD-10-CM | POA: Diagnosis not present

## 2016-01-10 DIAGNOSIS — I10 Essential (primary) hypertension: Secondary | ICD-10-CM | POA: Diagnosis not present

## 2016-01-10 DIAGNOSIS — N390 Urinary tract infection, site not specified: Secondary | ICD-10-CM | POA: Diagnosis not present

## 2016-01-11 DIAGNOSIS — G959 Disease of spinal cord, unspecified: Secondary | ICD-10-CM | POA: Diagnosis not present

## 2016-01-11 DIAGNOSIS — G9520 Unspecified cord compression: Secondary | ICD-10-CM | POA: Diagnosis not present

## 2016-01-11 DIAGNOSIS — M5030 Other cervical disc degeneration, unspecified cervical region: Secondary | ICD-10-CM | POA: Diagnosis not present

## 2016-01-11 DIAGNOSIS — M4712 Other spondylosis with myelopathy, cervical region: Secondary | ICD-10-CM | POA: Diagnosis not present

## 2016-01-11 DIAGNOSIS — M19041 Primary osteoarthritis, right hand: Secondary | ICD-10-CM | POA: Diagnosis not present

## 2016-01-25 DIAGNOSIS — M4712 Other spondylosis with myelopathy, cervical region: Secondary | ICD-10-CM | POA: Diagnosis not present

## 2016-01-25 DIAGNOSIS — M19042 Primary osteoarthritis, left hand: Secondary | ICD-10-CM | POA: Diagnosis not present

## 2016-01-25 DIAGNOSIS — M19041 Primary osteoarthritis, right hand: Secondary | ICD-10-CM | POA: Diagnosis not present

## 2016-01-25 DIAGNOSIS — Z791 Long term (current) use of non-steroidal anti-inflammatories (NSAID): Secondary | ICD-10-CM | POA: Diagnosis not present

## 2016-01-25 DIAGNOSIS — I10 Essential (primary) hypertension: Secondary | ICD-10-CM | POA: Diagnosis not present

## 2016-01-25 DIAGNOSIS — M5030 Other cervical disc degeneration, unspecified cervical region: Secondary | ICD-10-CM | POA: Diagnosis not present

## 2016-01-25 DIAGNOSIS — R6250 Unspecified lack of expected normal physiological development in childhood: Secondary | ICD-10-CM | POA: Diagnosis not present

## 2016-02-07 DIAGNOSIS — M19041 Primary osteoarthritis, right hand: Secondary | ICD-10-CM | POA: Diagnosis not present

## 2016-02-07 DIAGNOSIS — M5030 Other cervical disc degeneration, unspecified cervical region: Secondary | ICD-10-CM | POA: Diagnosis not present

## 2016-02-07 DIAGNOSIS — R6250 Unspecified lack of expected normal physiological development in childhood: Secondary | ICD-10-CM | POA: Diagnosis not present

## 2016-02-07 DIAGNOSIS — Z791 Long term (current) use of non-steroidal anti-inflammatories (NSAID): Secondary | ICD-10-CM | POA: Diagnosis not present

## 2016-02-07 DIAGNOSIS — M4712 Other spondylosis with myelopathy, cervical region: Secondary | ICD-10-CM | POA: Diagnosis not present

## 2016-02-07 DIAGNOSIS — M19042 Primary osteoarthritis, left hand: Secondary | ICD-10-CM | POA: Diagnosis not present

## 2016-02-07 DIAGNOSIS — I10 Essential (primary) hypertension: Secondary | ICD-10-CM | POA: Diagnosis not present

## 2016-02-08 DIAGNOSIS — R6250 Unspecified lack of expected normal physiological development in childhood: Secondary | ICD-10-CM | POA: Diagnosis not present

## 2016-02-08 DIAGNOSIS — Z791 Long term (current) use of non-steroidal anti-inflammatories (NSAID): Secondary | ICD-10-CM | POA: Diagnosis not present

## 2016-02-08 DIAGNOSIS — M19042 Primary osteoarthritis, left hand: Secondary | ICD-10-CM | POA: Diagnosis not present

## 2016-02-08 DIAGNOSIS — M5030 Other cervical disc degeneration, unspecified cervical region: Secondary | ICD-10-CM | POA: Diagnosis not present

## 2016-02-08 DIAGNOSIS — M4712 Other spondylosis with myelopathy, cervical region: Secondary | ICD-10-CM | POA: Diagnosis not present

## 2016-02-08 DIAGNOSIS — M19041 Primary osteoarthritis, right hand: Secondary | ICD-10-CM | POA: Diagnosis not present

## 2016-02-08 DIAGNOSIS — I10 Essential (primary) hypertension: Secondary | ICD-10-CM | POA: Diagnosis not present

## 2016-02-09 DIAGNOSIS — M19042 Primary osteoarthritis, left hand: Secondary | ICD-10-CM | POA: Diagnosis not present

## 2016-02-09 DIAGNOSIS — I10 Essential (primary) hypertension: Secondary | ICD-10-CM | POA: Diagnosis not present

## 2016-02-09 DIAGNOSIS — M5030 Other cervical disc degeneration, unspecified cervical region: Secondary | ICD-10-CM | POA: Diagnosis not present

## 2016-02-09 DIAGNOSIS — R6250 Unspecified lack of expected normal physiological development in childhood: Secondary | ICD-10-CM | POA: Diagnosis not present

## 2016-02-09 DIAGNOSIS — M4712 Other spondylosis with myelopathy, cervical region: Secondary | ICD-10-CM | POA: Diagnosis not present

## 2016-02-09 DIAGNOSIS — M19041 Primary osteoarthritis, right hand: Secondary | ICD-10-CM | POA: Diagnosis not present

## 2016-02-09 DIAGNOSIS — Z791 Long term (current) use of non-steroidal anti-inflammatories (NSAID): Secondary | ICD-10-CM | POA: Diagnosis not present

## 2016-02-15 DIAGNOSIS — M19042 Primary osteoarthritis, left hand: Secondary | ICD-10-CM | POA: Diagnosis not present

## 2016-02-15 DIAGNOSIS — R6250 Unspecified lack of expected normal physiological development in childhood: Secondary | ICD-10-CM | POA: Diagnosis not present

## 2016-02-15 DIAGNOSIS — M19041 Primary osteoarthritis, right hand: Secondary | ICD-10-CM | POA: Diagnosis not present

## 2016-02-15 DIAGNOSIS — Z791 Long term (current) use of non-steroidal anti-inflammatories (NSAID): Secondary | ICD-10-CM | POA: Diagnosis not present

## 2016-02-15 DIAGNOSIS — M5030 Other cervical disc degeneration, unspecified cervical region: Secondary | ICD-10-CM | POA: Diagnosis not present

## 2016-02-15 DIAGNOSIS — I10 Essential (primary) hypertension: Secondary | ICD-10-CM | POA: Diagnosis not present

## 2016-02-15 DIAGNOSIS — M4712 Other spondylosis with myelopathy, cervical region: Secondary | ICD-10-CM | POA: Diagnosis not present

## 2016-02-18 DIAGNOSIS — R6250 Unspecified lack of expected normal physiological development in childhood: Secondary | ICD-10-CM | POA: Diagnosis not present

## 2016-02-18 DIAGNOSIS — M19042 Primary osteoarthritis, left hand: Secondary | ICD-10-CM | POA: Diagnosis not present

## 2016-02-18 DIAGNOSIS — M4712 Other spondylosis with myelopathy, cervical region: Secondary | ICD-10-CM | POA: Diagnosis not present

## 2016-02-18 DIAGNOSIS — Z791 Long term (current) use of non-steroidal anti-inflammatories (NSAID): Secondary | ICD-10-CM | POA: Diagnosis not present

## 2016-02-18 DIAGNOSIS — M19041 Primary osteoarthritis, right hand: Secondary | ICD-10-CM | POA: Diagnosis not present

## 2016-02-18 DIAGNOSIS — I10 Essential (primary) hypertension: Secondary | ICD-10-CM | POA: Diagnosis not present

## 2016-02-18 DIAGNOSIS — M5030 Other cervical disc degeneration, unspecified cervical region: Secondary | ICD-10-CM | POA: Diagnosis not present

## 2016-02-21 DIAGNOSIS — Z791 Long term (current) use of non-steroidal anti-inflammatories (NSAID): Secondary | ICD-10-CM | POA: Diagnosis not present

## 2016-02-21 DIAGNOSIS — I10 Essential (primary) hypertension: Secondary | ICD-10-CM | POA: Diagnosis not present

## 2016-02-21 DIAGNOSIS — M5030 Other cervical disc degeneration, unspecified cervical region: Secondary | ICD-10-CM | POA: Diagnosis not present

## 2016-02-21 DIAGNOSIS — R6250 Unspecified lack of expected normal physiological development in childhood: Secondary | ICD-10-CM | POA: Diagnosis not present

## 2016-02-21 DIAGNOSIS — M19042 Primary osteoarthritis, left hand: Secondary | ICD-10-CM | POA: Diagnosis not present

## 2016-02-21 DIAGNOSIS — M19041 Primary osteoarthritis, right hand: Secondary | ICD-10-CM | POA: Diagnosis not present

## 2016-02-21 DIAGNOSIS — M4712 Other spondylosis with myelopathy, cervical region: Secondary | ICD-10-CM | POA: Diagnosis not present

## 2016-02-25 DIAGNOSIS — R6250 Unspecified lack of expected normal physiological development in childhood: Secondary | ICD-10-CM | POA: Diagnosis not present

## 2016-02-25 DIAGNOSIS — M19041 Primary osteoarthritis, right hand: Secondary | ICD-10-CM | POA: Diagnosis not present

## 2016-02-25 DIAGNOSIS — M5030 Other cervical disc degeneration, unspecified cervical region: Secondary | ICD-10-CM | POA: Diagnosis not present

## 2016-02-25 DIAGNOSIS — Z791 Long term (current) use of non-steroidal anti-inflammatories (NSAID): Secondary | ICD-10-CM | POA: Diagnosis not present

## 2016-02-25 DIAGNOSIS — M19042 Primary osteoarthritis, left hand: Secondary | ICD-10-CM | POA: Diagnosis not present

## 2016-02-25 DIAGNOSIS — I10 Essential (primary) hypertension: Secondary | ICD-10-CM | POA: Diagnosis not present

## 2016-02-25 DIAGNOSIS — M4712 Other spondylosis with myelopathy, cervical region: Secondary | ICD-10-CM | POA: Diagnosis not present

## 2016-06-16 DIAGNOSIS — Z1231 Encounter for screening mammogram for malignant neoplasm of breast: Secondary | ICD-10-CM | POA: Diagnosis not present

## 2016-08-19 ENCOUNTER — Inpatient Hospital Stay (HOSPITAL_BASED_OUTPATIENT_CLINIC_OR_DEPARTMENT_OTHER)
Admission: EM | Admit: 2016-08-19 | Discharge: 2016-08-21 | DRG: 639 | Disposition: A | Discharge status: Home / Self Care | Payer: Medicare Other | Attending: Family Medicine | Admitting: Family Medicine

## 2016-08-19 ENCOUNTER — Inpatient Hospital Stay (HOSPITAL_COMMUNITY): Payer: Medicare Other

## 2016-08-19 ENCOUNTER — Encounter (HOSPITAL_BASED_OUTPATIENT_CLINIC_OR_DEPARTMENT_OTHER): Payer: Self-pay | Admitting: Emergency Medicine

## 2016-08-19 DIAGNOSIS — F39 Unspecified mood [affective] disorder: Secondary | ICD-10-CM | POA: Diagnosis present

## 2016-08-19 DIAGNOSIS — Z833 Family history of diabetes mellitus: Secondary | ICD-10-CM

## 2016-08-19 DIAGNOSIS — Z87440 Personal history of urinary (tract) infections: Secondary | ICD-10-CM | POA: Diagnosis not present

## 2016-08-19 DIAGNOSIS — Z79899 Other long term (current) drug therapy: Secondary | ICD-10-CM | POA: Diagnosis not present

## 2016-08-19 DIAGNOSIS — E1100 Type 2 diabetes mellitus with hyperosmolarity without nonketotic hyperglycemic-hyperosmolar coma (NKHHC): Secondary | ICD-10-CM | POA: Diagnosis not present

## 2016-08-19 DIAGNOSIS — E1110 Type 2 diabetes mellitus with ketoacidosis without coma: Secondary | ICD-10-CM | POA: Diagnosis present

## 2016-08-19 DIAGNOSIS — I10 Essential (primary) hypertension: Secondary | ICD-10-CM | POA: Diagnosis present

## 2016-08-19 DIAGNOSIS — Z23 Encounter for immunization: Secondary | ICD-10-CM

## 2016-08-19 DIAGNOSIS — R6250 Unspecified lack of expected normal physiological development in childhood: Secondary | ICD-10-CM

## 2016-08-19 DIAGNOSIS — F89 Unspecified disorder of psychological development: Secondary | ICD-10-CM | POA: Diagnosis not present

## 2016-08-19 DIAGNOSIS — E1310 Other specified diabetes mellitus with ketoacidosis without coma: Principal | ICD-10-CM

## 2016-08-19 DIAGNOSIS — R350 Frequency of micturition: Secondary | ICD-10-CM | POA: Diagnosis not present

## 2016-08-19 DIAGNOSIS — E1159 Type 2 diabetes mellitus with other circulatory complications: Secondary | ICD-10-CM

## 2016-08-19 DIAGNOSIS — E876 Hypokalemia: Secondary | ICD-10-CM | POA: Diagnosis not present

## 2016-08-19 DIAGNOSIS — F79 Unspecified intellectual disabilities: Secondary | ICD-10-CM | POA: Diagnosis present

## 2016-08-19 DIAGNOSIS — T383X5A Adverse effect of insulin and oral hypoglycemic [antidiabetic] drugs, initial encounter: Secondary | ICD-10-CM | POA: Diagnosis not present

## 2016-08-19 DIAGNOSIS — R32 Unspecified urinary incontinence: Secondary | ICD-10-CM | POA: Diagnosis present

## 2016-08-19 DIAGNOSIS — R4182 Altered mental status, unspecified: Secondary | ICD-10-CM | POA: Diagnosis not present

## 2016-08-19 DIAGNOSIS — E1010 Type 1 diabetes mellitus with ketoacidosis without coma: Secondary | ICD-10-CM | POA: Diagnosis not present

## 2016-08-19 DIAGNOSIS — D72829 Elevated white blood cell count, unspecified: Secondary | ICD-10-CM | POA: Diagnosis not present

## 2016-08-19 LAB — I-STAT VENOUS BLOOD GAS, ED
Acid-base deficit: 4 mmol/L — ABNORMAL HIGH (ref 0.0–2.0)
Bicarbonate: 22.8 mmol/L (ref 20.0–28.0)
O2 Saturation: 45 %
PH VEN: 7.282 (ref 7.250–7.430)
TCO2: 24 mmol/L (ref 0–100)
pCO2, Ven: 48.3 mmHg (ref 44.0–60.0)
pO2, Ven: 28 mmHg — CL (ref 32.0–45.0)

## 2016-08-19 LAB — BASIC METABOLIC PANEL
ANION GAP: 16 — AB (ref 5–15)
Anion gap: 7 (ref 5–15)
BUN: 29 mg/dL — ABNORMAL HIGH (ref 6–20)
BUN: 19 mg/dL (ref 6–20)
CALCIUM: 8.9 mg/dL (ref 8.9–10.3)
CO2: 21 mmol/L — AB (ref 22–32)
CO2: 25 mmol/L (ref 22–32)
CREATININE: 0.92 mg/dL (ref 0.44–1.00)
Calcium: 10.3 mg/dL (ref 8.9–10.3)
Chloride: 105 mmol/L (ref 101–111)
Chloride: 109 mmol/L (ref 101–111)
Creatinine, Ser: 1.23 mg/dL — ABNORMAL HIGH (ref 0.44–1.00)
GFR calc Af Amer: >60 mL/min (ref >60)
GFR calc non Af Amer: 48 mL/min — ABNORMAL LOW (ref >60)
GFR calc non Af Amer: >60 mL/min (ref >60)
GFR, EST AFRICAN AMERICAN: 56 mL/min — AB (ref >60)
GLUCOSE: 114 mg/dL — AB (ref 65–99)
Glucose, Bld: 602 mg/dL (ref 65–99)
Potassium: 3.8 mmol/L (ref 3.5–5.1)
Potassium: 3.2 mmol/L — ABNORMAL LOW (ref 3.5–5.1)
SODIUM: 142 mmol/L (ref 135–145)
Sodium: 141 mmol/L (ref 135–145)

## 2016-08-19 LAB — CBC
HCT: 41.7 % (ref 36.0–46.0)
HEMOGLOBIN: 14.2 g/dL (ref 12.0–15.0)
MCH: 28.3 pg (ref 26.0–34.0)
MCHC: 34.1 g/dL (ref 30.0–36.0)
MCV: 83.1 fL (ref 78.0–100.0)
Platelets: 320 10*3/uL (ref 150–400)
RBC: 5.02 MIL/uL (ref 3.87–5.11)
RDW: 12.5 % (ref 11.5–15.5)
WBC: 12.7 10*3/uL — AB (ref 4.0–10.5)

## 2016-08-19 LAB — GLUCOSE, CAPILLARY
GLUCOSE-CAPILLARY: 207 mg/dL — AB (ref 65–99)
GLUCOSE-CAPILLARY: 148 mg/dL — AB (ref 65–99)
GLUCOSE-CAPILLARY: 118 mg/dL — AB (ref 65–99)

## 2016-08-19 LAB — URINALYSIS, ROUTINE W REFLEX MICROSCOPIC
BILIRUBIN URINE: NEGATIVE
Glucose, UA: >1000 mg/dL — AB
HGB URINE DIPSTICK: NEGATIVE
Ketones, ur: >80 mg/dL — AB
Leukocytes, UA: NEGATIVE
Nitrite: NEGATIVE
Protein, ur: NEGATIVE mg/dL
Specific Gravity, Urine: 1.027 (ref 1.005–1.030)
pH: 5 (ref 5.0–8.0)

## 2016-08-19 LAB — CBG MONITORING, ED
GLUCOSE-CAPILLARY: 579 mg/dL — AB (ref 65–99)
Glucose-Capillary: 492 mg/dL — ABNORMAL HIGH (ref 65–99)
Glucose-Capillary: 407 mg/dL — ABNORMAL HIGH (ref 65–99)
Glucose-Capillary: 337 mg/dL — ABNORMAL HIGH (ref 65–99)

## 2016-08-19 LAB — URINE MICROSCOPIC-ADD ON: RBC / HPF: NONE SEEN RBC/hpf (ref 0–5)

## 2016-08-19 LAB — TROPONIN I: Troponin I: <0.03 ng/mL (ref <0.03)

## 2016-08-19 MED ORDER — ARIPIPRAZOLE 10 MG PO TABS
5.0000 mg | ORAL_TABLET | Freq: Every day | ORAL | Status: DC
  Administered 2016-08-20 – 2016-08-21 (×2): 5 mg via ORAL
  Filled 2016-08-19 (×2): qty 1

## 2016-08-19 MED ORDER — DEXTROSE-NACL 5-0.45 % IV SOLN
INTRAVENOUS | Status: DC
  Administered 2016-08-19: 22:00:00 via INTRAVENOUS

## 2016-08-19 MED ORDER — BUSPIRONE HCL 10 MG PO TABS
10.0000 mg | ORAL_TABLET | Freq: Two times a day (BID) | ORAL | Status: DC
  Administered 2016-08-19 – 2016-08-21 (×4): 10 mg via ORAL
  Filled 2016-08-19: qty 2
  Filled 2016-08-19 (×2): qty 1
  Filled 2016-08-19: qty 2

## 2016-08-19 MED ORDER — SODIUM CHLORIDE 0.9 % IV BOLUS (SEPSIS)
1000.0000 mL | Freq: Once | INTRAVENOUS | Status: AC
  Administered 2016-08-19: 1000 mL via INTRAVENOUS

## 2016-08-19 MED ORDER — SODIUM CHLORIDE 0.9 % IV SOLN: INTRAVENOUS | Status: DC

## 2016-08-19 MED ORDER — INSULIN REGULAR HUMAN 100 UNIT/ML IJ SOLN
INTRAMUSCULAR | Status: AC
  Filled 2016-08-19: qty 1

## 2016-08-19 MED ORDER — LACTATED RINGERS IV BOLUS (SEPSIS)
1000.0000 mL | Freq: Once | INTRAVENOUS | Status: AC
  Administered 2016-08-19: 1000 mL via INTRAVENOUS

## 2016-08-19 MED ORDER — SODIUM CHLORIDE 0.9 % IV SOLN
Freq: Once | INTRAVENOUS | Status: AC
  Administered 2016-08-19: 1000 mL via INTRAVENOUS

## 2016-08-19 MED ORDER — DEXTROSE-NACL 5-0.45 % IV SOLN: INTRAVENOUS | Status: DC

## 2016-08-19 MED ORDER — SODIUM CHLORIDE 0.9 % IV SOLN
INTRAVENOUS | Status: DC
  Administered 2016-08-19: 4.3 [IU]/h via INTRAVENOUS
  Filled 2016-08-19: qty 2.5

## 2016-08-19 MED ORDER — LORAZEPAM 1 MG PO TABS: 1.0000 mg | ORAL_TABLET | Freq: Two times a day (BID) | ORAL | Status: DC | PRN

## 2016-08-19 MED ORDER — POTASSIUM CHLORIDE 10 MEQ/100ML IV SOLN
10.0000 meq | INTRAVENOUS | Status: AC
  Administered 2016-08-19 – 2016-08-20 (×2): 10 meq via INTRAVENOUS
  Filled 2016-08-19 (×2): qty 100

## 2016-08-19 MED ORDER — PAROXETINE HCL 20 MG PO TABS
40.0000 mg | ORAL_TABLET | Freq: Every day | ORAL | Status: DC
  Administered 2016-08-20 – 2016-08-21 (×2): 40 mg via ORAL
  Filled 2016-08-19 (×2): qty 2

## 2016-08-19 MED ORDER — TRAZODONE HCL 50 MG PO TABS
25.0000 mg | ORAL_TABLET | Freq: Every day | ORAL | Status: DC
  Administered 2016-08-19 – 2016-08-20 (×2): 25 mg via ORAL
  Filled 2016-08-19 (×2): qty 1

## 2016-08-19 NOTE — H&P (Signed)
History and Physical    Jocelyn SersCynthia Milbrath ZOX:[id number] DOB: Jul 23, 1961 DOA: 08/19/2016  PCP: Jackie PlumSEI-BONSU,GEORGE, MD   [facility]  Chief Complaint: Altered mental status  HPI: Jocelyn Simmons is a 55 y.o.  Woman with history of developmental delay and HTN who is accompanied by her sister Jocelyn Simmons who is her legal guardian.  Per Jocelyn Simmons, the patient has polyuria and weight loss for several weeks.  She has a history of UTI, but no prior diagnosis of diabetes.  The patient was brought to the ED because she was not acting like herself.    ED Course: Evaluation shows that the patient is in DKA.  NS and lactated ringer boluses given in the ED.  The patient was started on IV insulin infusion per protocol and [facility] for further management.   The patient denies any pain, anywhere.  No headache, chest pain, back pain, shortness of breath, nausea, vomiting, abdomina pain, dysuria, or diarrhea.  Review of Systems: As per HPI otherwise 10 point review of systems negative.    Past Medical History:  Diagnosis Date  . Hypertension   . Mental retardation     Past Surgical History:  Procedure Laterality Date  . ABDOMINAL HYSTERECTOMY    . BACK SURGERY    . CERVICAL SPINE SURGERY       reports that she has never smoked. She has never used smokeless tobacco. She reports that she does not drink alcohol or use drugs.  No Known Allergies  FAMILY HISTORY: Diabetes prevalent on both sides of the family, including mother and two sisters.  Prior to Admission medications   Medication Sig Start Date End Date Taking? Authorizing Provider  ARIPiprazole (ABILIFY PO) Take 25 mg by mouth daily.     Historical Provider, MD  azithromycin (ZITHROMAX Z-PAK) 250 MG tablet 2 po day one, then 1 daily x 4 days 09/26/15   Geoffery Lyonsouglas Delo, MD  busPIRone (BUSPAR) 10 MG tablet Take 10 mg by mouth 2 (two) times daily.    Historical Provider, MD  LORazepam  (ATIVAN PO) Take 1 mg by mouth 2 (two) times daily as needed.     Historical Provider, MD  PARoxetine (PAXIL) 40 MG tablet Take 40 mg by mouth every morning.    Historical Provider, MD  traZODone (DESYREL) 50 MG tablet Take 25 mg by mouth at bedtime.    Historical Provider, MD    Physical Exam: Vitals:   08/19/16 1637 08/19/16 1756 08/19/16 1931 08/19/16 2115  BP: 138/96 121/83 122/88 118/88  Pulse: 91 94 106   Resp: 12 16 16 19   Temp:      TempSrc:    Oral  SpO2: 100% 100% 100% 97%  Weight:    70.9 kg (156 lb 4.9 oz)  Height:    5\' 2"  (1.575 m)      Constitutional: NAD, calm, comfortable, NONtoxic appearing Vitals:   08/19/16 1637 08/19/16 1756 08/19/16 1931 08/19/16 2115  BP: 138/96 121/83 122/88 118/88  Pulse: 91 94 106   Resp: 12 16 16 19   Temp:      TempSrc:    Oral  SpO2: 100% 100% 100% 97%  Weight:    70.9 kg (156 lb 4.9 oz)  Height:    5\' 2"  (1.575 m)   Eyes: PERRL, lids and conjunctivae normal ENMT: Mucous membranes are moist. Posterior pharynx clear of any exudate or lesions.  Neck: normal appearance, supple Respiratory: clear to auscultation bilaterally,  no wheezing, no crackles. Normal respiratory effort. No accessory muscle use.  Cardiovascular: Normal rate, regular rhythm, no murmurs / rubs / gallops. No extremity edema. 2+ pedal pulses. GI: abdomen is soft and compressible.  No distention.  Mild LLQ tenderness but no guarding.  No masses palpated.  Bowel sounds are present. Musculoskeletal:  No joint deformity in upper and lower extremities. Good ROM, no contractures. Normal muscle tone.  Skin: no rashes, warm and dry Neurologic: No focal deficits. Psychiatric: Awake and alert.  Affect consistent with history of developmental delay but she is pleasant.    Labs on Admission: I have personally reviewed following labs and imaging studies  CBC:  Recent Labs Lab 08/19/16 1615  WBC 12.7*  HGB 14.2  HCT 41.7  MCV 83.1  PLT 320   Basic Metabolic  Panel:  Recent Labs Lab 08/19/16 1615  NA 142  K 3.8  CL 105  CO2 21*  GLUCOSE 602*  BUN 29*  CREATININE 1.23*  CALCIUM 10.3   CBG:  Recent Labs Lab 08/19/16 1547 08/19/16 1747 08/19/16 1854 08/19/16 1957 08/19/16 2110  GLUCAP 579* 492* 407* 337* 207*   Urine analysis:    Component Value Date/Time   COLORURINE YELLOW 08/19/2016 1515   APPEARANCEUR CLEAR 08/19/2016 1515   LABSPEC 1.027 08/19/2016 1515   PHURINE 5.0 08/19/2016 1515   GLUCOSEU >1000 (A) 08/19/2016 1515   HGBUR NEGATIVE 08/19/2016 1515   BILIRUBINUR NEGATIVE 08/19/2016 1515   KETONESUR >80 (A) 08/19/2016 1515   PROTEINUR NEGATIVE 08/19/2016 1515   UROBILINOGEN 1.0 07/13/2014 1240   NITRITE NEGATIVE 08/19/2016 1515   LEUKOCYTESUR NEGATIVE 08/19/2016 1515    Radiological Exams on Admission: Chest xray pending  EKG: Independently reviewed. NSR.  No acute ST segment changes.  Assessment/Plan Principal Problem:   DKA (diabetic ketoacidoses) (HCC) Active Problems:   Developmental delay disorder      DKA, new diagnosis of diabetes --IV insulin with IV fluids per protocol, NPO for now.  May have sips and chips for comfort. --Anticipate transition to subQ insulin by tomorrow --patient's sister will need diabetes education, discharge planning with appropriate regimen for home --Check A1c, troponin  Leukocytosis --U/A does not appear consistent with UTI --Will check chest xray  Developmental delay, mood disorder NOS --Continue home medications of abilify, buspar, paxil, trazodone.  Ativan prn.   DVT prophylaxis: SCDs Code Status: FULL Family Communication: Patient sister Jocelyn Golden who is her legal guardian present in the stepdown unit at time of admission. Disposition Plan: Expect she will go home at discharge. Consults called: NONE Admission status: Inpatient, stepdown unit   TIME SPENT: 55 minutes   Jerene Bears MD [facility] Pager [phone]  If 7PM-7AM,  please contact night-coverage [email or web address] Password [facility]  08/19/2016, 9:46 PM

## 2016-08-19 NOTE — ED Notes (Signed)
Report to Marchelle FolksAmanda- 2C- [facility]

## 2016-08-19 NOTE — Progress Notes (Signed)
Transfer Acceptance Note:  Hx of developmental delay-presented to [facility] with urinary frequency, tachy to the 130's initially. Found to have hyperglycemia (>600) with mild gap of 16. Started on IVF, IV Insulin-accepted to step down bed. Full H&P [facility]

## 2016-08-19 NOTE — ED Triage Notes (Signed)
Pt in c/o urinary frequency and some incontinence that is new, onset 2 weeks ago. Family states pt is not acting like herself. Pt alert, interactive, in NAD.

## 2016-08-19 NOTE — ED Notes (Signed)
Water given per pt request and ok'd by MD

## 2016-08-19 NOTE — ED Provider Notes (Signed)
[facility] Provider Note   CSN: [id number] Arrival date & time: 08/19/16  1330  By signing my name below, I, Arianna Nassar, attest that this documentation has been prepared under the direction and in the presence of Nira ConnPedro Eduardo Cardama, MD.  Electronically Signed: Octavia HeirArianna Nassar, ED Scribe. 08/19/16. 4:28 PM.    History   Chief Complaint Chief Complaint  Patient presents with  . Urinary Frequency  . Urinary Incontinence    The history is provided by a relative. No language interpreter was used.   HPI Comments: Jocelyn Simmons is a 55 y.o. female who has a PMHx of mental retardation, DM and HTN presents to the Emergency Department complaining of sudden onset, gradual worsening, moderate urinary frequency x 1 week ago. She has also been having urinary incontinence and increased thirst. Pt is in the car of her sister. According to sister, pt had a sugar level of 561. Pt was seen at her PCP about 3 weeks ago. Per sister, pt was seen and treated for UTI months ago without being given any steroids. She was given medication but notes that she was only given one dose this morning. Denies fever, nausea, vomiting, diarrhea, chest pain, shortness of breath, abdominal pain, or rash.  Past Medical History:  Diagnosis Date  . Hypertension   . Mental retardation     There are no active problems to display for this patient.   Past Surgical History:  Procedure Laterality Date  . ABDOMINAL HYSTERECTOMY    . BACK SURGERY    . CERVICAL SPINE SURGERY      OB History    No data available       Home Medications    Prior to Admission medications   Medication Sig Start Date End Date Taking? Authorizing Provider  ARIPiprazole (ABILIFY PO) Take 25 mg by mouth daily.     Historical Provider, MD  azithromycin (ZITHROMAX Z-PAK) 250 MG tablet 2 po day one, then 1 daily x 4 days 09/26/15   Geoffery Lyonsouglas Delo, MD  busPIRone (BUSPAR) 10 MG tablet Take 10 mg by mouth 2 (two) times daily.     Historical Provider, MD  LORazepam (ATIVAN PO) Take 1 mg by mouth 2 (two) times daily as needed.     Historical Provider, MD  PARoxetine (PAXIL) 40 MG tablet Take 40 mg by mouth every morning.    Historical Provider, MD  traZODone (DESYREL) 50 MG tablet Take 25 mg by mouth at bedtime.    Historical Provider, MD    Family History History reviewed. No pertinent family history.  Social History Social History  Substance Use Topics  . Smoking status: Never Smoker  . Smokeless tobacco: Never Used  . Alcohol use No     Allergies   Review of patient's allergies indicates no known allergies.   Review of Systems Review of Systems  A complete 10 system review of systems was obtained and all systems are negative except as noted in the HPI and PMH.   Physical Exam Updated Vital Signs BP 148/97 (BP Location: Left Arm)   Pulse (!) 130   Temp 98 F (36.7 C) (Oral)   Resp 20   Ht 5\' 3"  (1.6 m)   Wt 167 lb (75.8 kg)   SpO2 97%   BMI 29.58 kg/m   Physical Exam  Constitutional: She appears well-developed and well-nourished. No distress.  HENT:  Head: Normocephalic and atraumatic.  Nose: Nose normal.  Eyes: Conjunctivae and EOM are normal. Pupils are equal, round,  and reactive to light. Right eye exhibits no discharge. Left eye exhibits no discharge. No scleral icterus.  Neck: Normal range of motion. Neck supple.  Cardiovascular: Normal rate and regular rhythm.  Exam reveals no gallop and no friction rub.   No murmur heard. Pulmonary/Chest: Effort normal and breath sounds normal. No stridor. No respiratory distress. She has no rales.  Abdominal: Soft. She exhibits no distension. There is no tenderness.  Musculoskeletal: She exhibits no edema or tenderness.  Neurological: She is alert. She has normal strength. No cranial nerve deficit or sensory deficit.  Baseline mental delay  Skin: Skin is warm and dry. No rash noted. She is not diaphoretic. No erythema.  Psychiatric: She has a  normal mood and affect.  Vitals reviewed.    ED Treatments / Results  DIAGNOSTIC STUDIES: Oxygen Saturation is 97% on RA, normal by my interpretation.  COORDINATION OF CARE:  4:24 PM Discussed treatment plan with pt at bedside and pt agreed to plan.  Labs (all labs ordered are listed, but only abnormal results are displayed) Labs Reviewed  URINALYSIS, ROUTINE W REFLEX MICROSCOPIC (NOT AT [facility]) - Abnormal; Notable for the following:       Result Value   Glucose, UA >1000 (*)    Ketones, ur >80 (*)    All other components within normal limits  URINE MICROSCOPIC-ADD ON - Abnormal; Notable for the following:    Squamous Epithelial / LPF 0-5 (*)    Bacteria, UA RARE (*)    All other components within normal limits  CBG MONITORING, ED - Abnormal; Notable for the following:    Glucose-Capillary 579 (*)    All other components within normal limits  BASIC METABOLIC PANEL  CBC  CBG MONITORING, ED  I-STAT VENOUS BLOOD GAS, ED    EKG  EKG Interpretation None       Radiology Dg Chest 1 View  Result Date: 08/19/2016 CLINICAL DATA:  Diabetic ketoacidosis EXAM: CHEST 1 VIEW COMPARISON:  09/26/2015 FINDINGS: Shallow inspiration with elevation of the left hemidiaphragm. Normal heart size and pulmonary vascularity. No focal airspace disease or consolidation in the lungs. No blunting of costophrenic angles. No pneumothorax. Mediastinal contours appear intact. No change since prior study. IMPRESSION: Chronic elevation of left hemidiaphragm. No evidence of active pulmonary disease. Electronically Signed   By: Burman Nieves M.D.   On: 08/19/2016 22:17    Procedures Procedures (including critical care time)  Medications Ordered in ED Medications - No data to display   Initial Impression / Assessment and Plan / ED Course  I have reviewed the triage vital signs and the nursing notes.  Pertinent labs & imaging results that were available during my care of the patient were reviewed by  me and considered in my medical decision making (see chart for details).  Clinical Course    Workup consistent with new onset diabetes and DKA. IV fluids initiated. Patient started on insulin drip. She remained hemodynamically stable and during our care however requires admission for continued management  Appreciate hospitalist team for stepdown unit admission.  CRITICAL CARE Performed by: Amadeo Garnet Cardama Total critical care time: 30 minutes Critical care time was exclusive of separately billable procedures and treating other patients. Critical care was necessary to treat or prevent imminent or life-threatening deterioration. Critical care was time spent personally by me on the following activities: development of treatment plan with patient and/or surrogate as well as nursing, discussions with consultants, evaluation of patient's response to treatment, examination of patient, obtaining  history from patient or surrogate, ordering and performing treatments and interventions, ordering and review of laboratory studies, ordering and review of radiographic studies, pulse oximetry and re-evaluation of patient's condition.   I personally performed the services described in this documentation, which was scribed in my presence. The recorded information has been reviewed and is accurate.   Final Clinical Impressions(s) / ED Diagnoses   Final diagnoses:  Diabetic ketoacidosis without coma associated with other specified diabetes mellitus (HCC)  DKA (diabetic ketoacidoses) (HCC)    Disposition: Admit  Condition: stable    Nira Conn, MD 08/21/16 [phone]

## 2016-08-20 DIAGNOSIS — F89 Unspecified disorder of psychological development: Secondary | ICD-10-CM

## 2016-08-20 DIAGNOSIS — E1159 Type 2 diabetes mellitus with other circulatory complications: Secondary | ICD-10-CM

## 2016-08-20 DIAGNOSIS — I10 Essential (primary) hypertension: Secondary | ICD-10-CM

## 2016-08-20 DIAGNOSIS — D72829 Elevated white blood cell count, unspecified: Secondary | ICD-10-CM

## 2016-08-20 DIAGNOSIS — E1110 Type 2 diabetes mellitus with ketoacidosis without coma: Secondary | ICD-10-CM

## 2016-08-20 LAB — GLUCOSE, CAPILLARY
GLUCOSE-CAPILLARY: 127 mg/dL — AB (ref 65–99)
GLUCOSE-CAPILLARY: 165 mg/dL — AB (ref 65–99)
GLUCOSE-CAPILLARY: 183 mg/dL — AB (ref 65–99)
GLUCOSE-CAPILLARY: 221 mg/dL — AB (ref 65–99)
GLUCOSE-CAPILLARY: 235 mg/dL — AB (ref 65–99)
GLUCOSE-CAPILLARY: 274 mg/dL — AB (ref 65–99)
GLUCOSE-CAPILLARY: 277 mg/dL — AB (ref 65–99)
Glucose-Capillary: 140 mg/dL — ABNORMAL HIGH (ref 65–99)
Glucose-Capillary: 183 mg/dL — ABNORMAL HIGH (ref 65–99)
Glucose-Capillary: 227 mg/dL — ABNORMAL HIGH (ref 65–99)

## 2016-08-20 LAB — HEPATIC FUNCTION PANEL
ALK PHOS: 44 U/L (ref 38–126)
ALT: 18 U/L (ref 14–54)
AST: 22 U/L (ref 15–41)
Albumin: 3.3 g/dL — ABNORMAL LOW (ref 3.5–5.0)
BILIRUBIN INDIRECT: 0.9 mg/dL (ref 0.3–0.9)
BILIRUBIN TOTAL: 1.1 mg/dL (ref 0.3–1.2)
Bilirubin, Direct: 0.2 mg/dL (ref 0.1–0.5)
Total Protein: 5.8 g/dL — ABNORMAL LOW (ref 6.5–8.1)

## 2016-08-20 LAB — CBC WITH DIFFERENTIAL/PLATELET
BASOS ABS: 0 10*3/uL (ref 0.0–0.1)
BASOS PCT: 0 %
EOS ABS: 0.1 10*3/uL (ref 0.0–0.7)
Eosinophils Relative: 1 %
HEMATOCRIT: 34.3 % — AB (ref 36.0–46.0)
HEMOGLOBIN: 11 g/dL — AB (ref 12.0–15.0)
Lymphocytes Relative: 40 %
Lymphs Abs: 3.6 10*3/uL (ref 0.7–4.0)
MCH: 27.2 pg (ref 26.0–34.0)
MCHC: 32.1 g/dL (ref 30.0–36.0)
MCV: 84.9 fL (ref 78.0–100.0)
MONOS PCT: 6 %
Monocytes Absolute: 0.6 10*3/uL (ref 0.1–1.0)
NEUTROS ABS: 4.7 10*3/uL (ref 1.7–7.7)
NEUTROS PCT: 53 %
Platelets: 238 10*3/uL (ref 150–400)
RBC: 4.04 MIL/uL (ref 3.87–5.11)
RDW: 12.4 % (ref 11.5–15.5)
WBC: 9 10*3/uL (ref 4.0–10.5)

## 2016-08-20 LAB — BASIC METABOLIC PANEL
ANION GAP: 7 (ref 5–15)
ANION GAP: 8 (ref 5–15)
Anion gap: 3 — ABNORMAL LOW (ref 5–15)
Anion gap: 7 (ref 5–15)
BUN: 13 mg/dL (ref 6–20)
BUN: 14 mg/dL (ref 6–20)
BUN: 14 mg/dL (ref 6–20)
BUN: 16 mg/dL (ref 6–20)
CALCIUM: 8.6 mg/dL — AB (ref 8.9–10.3)
CALCIUM: 8.7 mg/dL — AB (ref 8.9–10.3)
CALCIUM: 8.7 mg/dL — AB (ref 8.9–10.3)
CALCIUM: 8.9 mg/dL (ref 8.9–10.3)
CHLORIDE: 102 mmol/L (ref 101–111)
CO2: 25 mmol/L (ref 22–32)
CO2: 26 mmol/L (ref 22–32)
CO2: 26 mmol/L (ref 22–32)
CO2: 26 mmol/L (ref 22–32)
CREATININE: 0.9 mg/dL (ref 0.44–1.00)
CREATININE: 0.91 mg/dL (ref 0.44–1.00)
CREATININE: 0.92 mg/dL (ref 0.44–1.00)
Chloride: 110 mmol/L (ref 101–111)
Chloride: 105 mmol/L (ref 101–111)
Chloride: 102 mmol/L (ref 101–111)
Creatinine, Ser: 0.88 mg/dL (ref 0.44–1.00)
GFR calc Af Amer: >60 mL/min (ref >60)
GFR calc Af Amer: >60 mL/min (ref >60)
GFR calc Af Amer: >60 mL/min (ref >60)
GFR calc Af Amer: >60 mL/min (ref >60)
GFR calc non Af Amer: >60 mL/min (ref >60)
GFR calc non Af Amer: >60 mL/min (ref >60)
GFR calc non Af Amer: >60 mL/min (ref >60)
GFR calc non Af Amer: >60 mL/min (ref >60)
GLUCOSE: 150 mg/dL — AB (ref 65–99)
GLUCOSE: 236 mg/dL — AB (ref 65–99)
GLUCOSE: 242 mg/dL — AB (ref 65–99)
GLUCOSE: 324 mg/dL — AB (ref 65–99)
Potassium: 3.2 mmol/L — ABNORMAL LOW (ref 3.5–5.1)
Potassium: 3 mmol/L — ABNORMAL LOW (ref 3.5–5.1)
Potassium: 3.2 mmol/L — ABNORMAL LOW (ref 3.5–5.1)
Potassium: 3.1 mmol/L — ABNORMAL LOW (ref 3.5–5.1)
Sodium: 139 mmol/L (ref 135–145)
Sodium: 138 mmol/L (ref 135–145)
Sodium: 135 mmol/L (ref 135–145)
Sodium: 135 mmol/L (ref 135–145)

## 2016-08-20 LAB — LIPID PANEL
CHOL/HDL RATIO: 3.5 ratio
CHOLESTEROL: 140 mg/dL (ref 0–200)
HDL: 40 mg/dL — AB (ref >40)
LDL Cholesterol: 90 mg/dL (ref 0–99)
Triglycerides: 52 mg/dL (ref <150)
VLDL: 10 mg/dL (ref 0–40)

## 2016-08-20 LAB — MRSA PCR SCREENING: MRSA BY PCR: NEGATIVE

## 2016-08-20 LAB — TROPONIN I: Troponin I: <0.03 ng/mL (ref <0.03)

## 2016-08-20 MED ORDER — INFLUENZA VAC SPLIT QUAD 0.5 ML IM SUSY
0.5000 mL | PREFILLED_SYRINGE | INTRAMUSCULAR | Status: AC
  Administered 2016-08-20: 0.5 mL via INTRAMUSCULAR
  Filled 2016-08-20: qty 0.5

## 2016-08-20 MED ORDER — INSULIN ASPART 100 UNIT/ML ~~LOC~~ SOLN
0.0000 [IU] | Freq: Three times a day (TID) | SUBCUTANEOUS | Status: DC
  Administered 2016-08-20: 8 [IU] via SUBCUTANEOUS
  Administered 2016-08-20: 3 [IU] via SUBCUTANEOUS
  Administered 2016-08-20: 5 [IU] via SUBCUTANEOUS
  Administered 2016-08-21: 8 [IU] via SUBCUTANEOUS
  Administered 2016-08-21: 11 [IU] via SUBCUTANEOUS

## 2016-08-20 MED ORDER — LIVING WELL WITH DIABETES BOOK
Freq: Once | Status: AC
  Administered 2016-08-20: 13:00:00
  Filled 2016-08-20: qty 1

## 2016-08-20 MED ORDER — INSULIN GLARGINE 100 UNIT/ML ~~LOC~~ SOLN
10.0000 [IU] | Freq: Every day | SUBCUTANEOUS | Status: DC
  Administered 2016-08-20 – 2016-08-21 (×2): 10 [IU] via SUBCUTANEOUS
  Filled 2016-08-20 (×2): qty 0.1

## 2016-08-20 MED ORDER — PNEUMOCOCCAL VAC POLYVALENT 25 MCG/0.5ML IJ INJ
0.5000 mL | INJECTION | INTRAMUSCULAR | Status: AC
  Administered 2016-08-20: 0.5 mL via INTRAMUSCULAR
  Filled 2016-08-20: qty 0.5

## 2016-08-20 MED ORDER — INSULIN GLARGINE 100 UNIT/ML ~~LOC~~ SOLN
10.0000 [IU] | Freq: Every day | SUBCUTANEOUS | Status: DC
  Filled 2016-08-20: qty 0.1

## 2016-08-20 MED ORDER — POTASSIUM CHLORIDE 10 MEQ/100ML IV SOLN
10.0000 meq | INTRAVENOUS | Status: AC
  Administered 2016-08-20 (×3): 10 meq via INTRAVENOUS
  Filled 2016-08-20: qty 100

## 2016-08-20 MED ORDER — INSULIN STARTER KIT- PEN NEEDLES (ENGLISH)
1.0000 | Freq: Once | Status: AC
  Administered 2016-08-20: 1
  Filled 2016-08-20: qty 1

## 2016-08-20 NOTE — Progress Notes (Signed)
08/20/2016 patient receive Flu Vaccine at 1126 in right deltoid. Lot #:[id number] and expire 05/03/17. Pneumococcal given in at 1120 given in left deltoid. Lot: [id number] and expire October 16, 2017. [facility] Oki RN.

## 2016-08-20 NOTE — Progress Notes (Signed)
Inpatient Diabetes Program Recommendations  AACE/ADA: New Consensus Statement on Inpatient Glycemic Control (2015)  Target Ranges:  Prepandial:   less than 140 mg/dL      Peak postprandial:   less than 180 mg/dL (1-2 hours)      Critically ill patients:  140 - 180 mg/dL   Lab Results  Component Value Date   GLUCAP 165 (H) 08/20/2016    Review of Glycemic Control  Diabetes history: No prior hx  Inpatient Diabetes Program Recommendations:  Noted patient is new onset. RNs to provide ongoing basic DM education at bedside with this patient. Have ordered educational booklet, insulin starter kit, and DM videos. Have also placed RD consult for DM diet education for this patient.  Will plan to see patient and sister tomorrow 9/25.  Thank you, Nani Gasser. Manolito Jurewicz, RN, MSN, CDE Inpatient Glycemic Control Team Team Pager [phone] (8am-5pm) 08/20/2016 9:25 AM

## 2016-08-20 NOTE — Progress Notes (Signed)
PROGRESS NOTE [facility]   Jocelyn SersCynthia Kimbley   WUJ:[id number] DOB: 1961/06/28  DOA: 08/19/2016 PCP: Jackie PlumSEI-BONSU,GEORGE, MD   Brief Narrative:  Jocelyn Simmons is a 55 y.o.  Woman with history of developmental delay and HTN who is accompanied by her sister Jocelyn Simmons who is her legal guardian. Per Jocelyn Simmons, the patient has polyuria and weight loss for several weeks.  She has a history of UTI, but no prior diagnosis of diabetes.  The patient was brought to the ED because she was not acting like herself. Found to have elevated blood glucose and urine ketones. Patient was admitted to step down unit for DKA and new diagnosis of DM Patient was started in insulin drip and DKA protocol. Now anion gap closed and CBG well controlled.    Subjective:  Patient seen and examined with sister at bedside. Patient have no complaints this morning, per sister patient had a good night. Patient now off insulin drip and fluid. No acute events over night.   Assessment & Plan:   DKA (diabetic ketoacidoses) (HCC) with a new diagnosis of DM, Anion gap closed 7 now on Lantus and sliding scale.  - Check A1C - Transfer to med surge  - Continue Lantus and sliding scale  - Monitor CBGs - Carb modified diet - DM educations   Leukocytosis - no signs of infection UA negative and CXR normal, afebrile, could be demargination   - Repeat CBC with diff  - Monitor   HTN stable  - Continue home med  Developmental delay disorder - Continue home medications of abilify, buspar, paxil, trazodone.  Ativan prn   DVT prophylaxis: SCD's Code Status: FULL Family Communication: Patient sister Jocelyn Simmons who is her legal guardian present, plan discussed in details.   Disposition Plan: Expect she will go home at discharge.   Consultants:   None   Procedures:   None  Antimicrobials:  None    Objective: Vitals:   08/19/16 2115 08/19/16 2316 08/20/16 0244 08/20/16 0825  BP: 118/88 115/76 (!) 104/41 123/71  Pulse:   94    Resp: 19 17 20 14   Temp:  98.2 F (36.8 C) 98.1 F (36.7 C) 97.9 F (36.6 C)  TempSrc: Oral Oral Axillary Oral  SpO2: 97% 99% 100%   Weight: 70.9 kg (156 lb 4.9 oz)     Height: 5\' 2"  (1.575 m)       Intake/Output Summary (Last 24 hours) at 08/20/16 0859 Last data filed at 08/20/16 0240  Gross per 24 hour  Intake          1658.33 ml  Output              350 ml  Net          1308.33 ml   Filed Weights   08/19/16 1339 08/19/16 2115  Weight: 75.8 kg (167 lb) 70.9 kg (156 lb 4.9 oz)    Examination:  General exam: Appears calm and comfortable  HEENT: AC/AT, PERRLA, OP moist and clear Respiratory system: Clear to auscultation. No wheezes,crackle or rhonchi Cardiovascular system: S1 & S2 heard, RRR. No JVD, murmurs, rubs or gallops Gastrointestinal system: Abdomen is nondistended, soft and nontender. No organomegaly or masses felt.. Central nervous system: Alert and oriented. No focal neurological deficits. Extremities: No pedal edema. Symmetric, strength 5/5   Skin: No rashes, lesions or ulcers Psychiatry: Consistent with developmental delay    Data Reviewed: I have personally reviewed following labs and imaging studies  CBC:  Recent Labs Lab 08/19/16 1615  WBC 12.7*  HGB 14.2  HCT 41.7  MCV 83.1  PLT 320   Basic Metabolic Panel:  Recent Labs Lab 08/19/16 1615 08/19/16 2240 08/20/16 0049 08/20/16 0403  NA 142 141 139 138  K 3.8 3.2* 3.2* 3.0*  CL 105 109 110 105  CO2 21* 25 26 26   GLUCOSE 602* 114* 150* 242*  BUN 29* 19 16 14   CREATININE 1.23* 0.92 0.92 0.91  CALCIUM 10.3 8.9 8.7* 8.7*   GFR: Estimated Creatinine Clearance: 64.4 mL/min (by C-G formula based on SCr of 0.91 mg/dL). Liver Function Tests:  Recent Labs Lab 08/20/16 0049  AST 22  ALT 18  ALKPHOS 44  BILITOT 1.1  PROT 5.8*  ALBUMIN 3.3*   No results for input(s): LIPASE, AMYLASE in the last 168 hours. No results for input(s): AMMONIA in the last 168 hours. Coagulation  Profile: No results for input(s): INR, PROTIME in the last 168 hours. Cardiac Enzymes:  Recent Labs Lab 08/19/16 2240 08/20/16 0403  TROPONINI <0.03 <0.03   BNP (last 3 results) No results for input(s): PROBNP in the last 8760 hours. HbA1C: No results for input(s): HGBA1C in the last 72 hours. CBG:  Recent Labs Lab 08/20/16 0133 08/20/16 0238 08/20/16 0343 08/20/16 0446 08/20/16 0555  GLUCAP 127* 183* 221* 235* 165*   Lipid Profile:  Recent Labs  08/20/16 0403  CHOL 140  HDL 40*  LDLCALC 90  TRIG 52  CHOLHDL 3.5   Thyroid Function Tests: No results for input(s): TSH, T4TOTAL, FREET4, T3FREE, THYROIDAB in the last 72 hours. Anemia Panel: No results for input(s): VITAMINB12, FOLATE, FERRITIN, TIBC, IRON, RETICCTPCT in the last 72 hours. Sepsis Labs: No results for input(s): PROCALCITON, LATICACIDVEN in the last 168 hours.  Recent Results (from the past 240 hour(s))  MRSA PCR Screening     Status: None   Collection Time: 08/19/16  9:15 PM  Result Value Ref Range Status   MRSA by PCR NEGATIVE NEGATIVE Final    Comment:        The GeneXpert MRSA Assay (FDA approved for NASAL specimens only), is one component of a comprehensive MRSA colonization surveillance program. It is not intended to diagnose MRSA infection nor to guide or monitor treatment for MRSA infections.      Radiology Studies: Dg Chest 1 View  Result Date: 08/19/2016 CLINICAL DATA:  Diabetic ketoacidosis EXAM: CHEST 1 VIEW COMPARISON:  09/26/2015 FINDINGS: Shallow inspiration with elevation of the left hemidiaphragm. Normal heart size and pulmonary vascularity. No focal airspace disease or consolidation in the lungs. No blunting of costophrenic angles. No pneumothorax. Mediastinal contours appear intact. No change since prior study. IMPRESSION: Chronic elevation of left hemidiaphragm. No evidence of active pulmonary disease. Electronically Signed   By: Burman Nieves M.D.   On: 08/19/2016 22:17       Scheduled Meds: . ARIPiprazole  5 mg Oral Daily  . busPIRone  10 mg Oral BID  . Influenza vac split quadrivalent PF  0.5 mL Intramuscular Tomorrow-1000  . insulin aspart  0-15 Units Subcutaneous TID WC  . insulin glargine  10 Units Subcutaneous Daily  . PARoxetine  40 mg Oral Daily  . pneumococcal 23 valent vaccine  0.5 mL Intramuscular Tomorrow-1000  . traZODone  25 mg Oral QHS   Continuous Infusions: . sodium chloride Stopped (08/19/16 2220)  . dextrose 5 % and 0.45% NaCl 125 mL/hr at 08/19/16 2220  . insulin (NOVOLIN-R) infusion Stopped (08/20/16 0558)     LOS: 1 day  Latrelle Dodrill, MD [facility] Pager [phone]  If 7PM-7AM, please contact night-coverage [email or web address] Password [email or web address] 08/20/2016, 8:59 AM

## 2016-08-21 DIAGNOSIS — Z23 Encounter for immunization: Secondary | ICD-10-CM | POA: Diagnosis not present

## 2016-08-21 LAB — BASIC METABOLIC PANEL
Anion gap: 9 (ref 5–15)
BUN: 8 mg/dL (ref 6–20)
CHLORIDE: 103 mmol/L (ref 101–111)
CO2: 25 mmol/L (ref 22–32)
CREATININE: 0.76 mg/dL (ref 0.44–1.00)
Calcium: 9.1 mg/dL (ref 8.9–10.3)
GFR calc Af Amer: >60 mL/min (ref >60)
GFR calc non Af Amer: >60 mL/min (ref >60)
GLUCOSE: 312 mg/dL — AB (ref 65–99)
Potassium: 3.4 mmol/L — ABNORMAL LOW (ref 3.5–5.1)
Sodium: 137 mmol/L (ref 135–145)

## 2016-08-21 LAB — GLUCOSE, CAPILLARY
Glucose-Capillary: 310 mg/dL — ABNORMAL HIGH (ref 65–99)
Glucose-Capillary: 284 mg/dL — ABNORMAL HIGH (ref 65–99)

## 2016-08-21 LAB — HEMOGLOBIN A1C
Hgb A1c MFr Bld: 8 % — ABNORMAL HIGH (ref 4.8–5.6)
Hgb A1c MFr Bld: 8.1 % — ABNORMAL HIGH (ref 4.8–5.6)
MEAN PLASMA GLUCOSE: 186 mg/dL
Mean Plasma Glucose: 183 mg/dL

## 2016-08-21 MED ORDER — GLIPIZIDE 5 MG PO TABS: 5.0000 mg | ORAL_TABLET | Freq: Every day | ORAL | 0 refills | Status: DC

## 2016-08-21 MED ORDER — POTASSIUM CHLORIDE CRYS ER 20 MEQ PO TBCR
30.0000 meq | EXTENDED_RELEASE_TABLET | Freq: Once | ORAL | Status: AC
  Administered 2016-08-21: 30 meq via ORAL
  Filled 2016-08-21: qty 1

## 2016-08-21 MED ORDER — METFORMIN HCL 1000 MG PO TABS: 1000.0000 mg | ORAL_TABLET | Freq: Two times a day (BID) | ORAL | 1 refills | Status: AC

## 2016-08-21 MED ORDER — POTASSIUM CHLORIDE 10 MEQ/100ML IV SOLN: 10.0000 meq | INTRAVENOUS | Status: AC

## 2016-08-21 MED ORDER — BLOOD GLUCOSE METER KIT: PACK | 0 refills | Status: AC

## 2016-08-21 NOTE — Discharge Summary (Signed)
Physician Discharge Summary  Jocelyn Simmons  JJO:[id number]  DOB: 1961-06-21  DOA: 08/19/2016 PCP: Jocelyn Mccreedy, MD  Admit date: 08/19/2016 Discharge date: 08/21/2016  Admitted From: Home Disposition: Home  Recommendations for Outpatient Follow-up:  1. Follow up with PCP in 1 week 2. Please obtain BMP/CBC in one week 3. Please follow up on the following pending results: A1C  Home Health: None Equipment/Devices: Blood glucose meter  Discharge Condition: Stable CODE STATUS: Full Diet recommendation: Heart Healthy / Carb Modified   Brief/Interim Summary:  Jocelyn Simmons a 55 y.o.Woman with history of developmental delay and HTN who is accompanied by her sister Jocelyn Simmons who is her legal guardian. Per Jocelyn Simmons, the patient had polyuria and weight loss for several weeks. She has a history of UTI, but no prior diagnosis of diabetes. The patient was brought to the ED because she was not acting like herself. Found to have elevated blood glucose 600's with increase anion gap and urine ketones. Patient was admitted to step down unit for DKA and new diagnosis of DM Patient was started in insulin drip and DKA protocol. Anion gap was closed, long acting insulin was started and CBGs were well controlled. Patient sister was educated on diabetes. Patient didn't significantly improve and will be sent home with metformin 1000 milligrams twice a day and glipizide 5 mg daily. Patient will be discharged home today and follow-up with primary care provider in one week.  Subjective:  Patient was seen and examined in the morning, per patient and sister have no complaints. No acute events overnight. Denies abdominal pain, nausea, diarrhea, chest pain, shortness of breath and dizziness.  Discharge Diagnoses:   DKA (diabetic ketoacidoses) ([place]) with a new diagnosis of DM A1c 8.0 - Start metformin 1000 mg twice a day - first dose tonight - Start glipizide 5 mg daily - first dose tomorrow morning, take  only with food. - Carb modified diet - DM educations  - Signs and symptoms of hypoglycemia discussed with sister advised to go to the ER or call primary care provider for evaluation  Hypokalemia - Jocelyn Simmons secondary to insulin use - Potassium repleated - Repeat BMP in 1 week  Leukocytosis - no signs of infection UA negative and CXR normal, afebrile, could be demargination RESOLVED    HTN stable  - Continue home med  Developmental delay disorder - Continue home medications of abilify, buspar, paxil, trazodone. Ativan prn   Discharge Instructions  Discharge Instructions    Call MD for:    Complete by:  As directed    Signs of hypoglycemia - tremors, dizziness, sweating, etc   Call MD for:  difficulty breathing, headache or visual disturbances    Complete by:  As directed    Call MD for:  extreme fatigue    Complete by:  As directed    Call MD for:  hives    Complete by:  As directed    Call MD for:  persistant dizziness or light-headedness    Complete by:  As directed    Call MD for:  persistant nausea and vomiting    Complete by:  As directed    Call MD for:  redness, tenderness, or signs of infection (pain, swelling, redness, odor or green/yellow discharge around incision site)    Complete by:  As directed    Call MD for:  severe uncontrolled pain    Complete by:  As directed    Call MD for:  temperature >100.4    Complete by:  As directed  Diet - low sodium heart healthy    Complete by:  As directed    Diet Carb Modified    Complete by:  As directed    Discharge instructions    Complete by:  As directed    Discharge instructions    Complete by:  As directed    Check sugar 1 time a day, in the morning before breakfast  Make log of your sugar number and take it to your doctor Follow up with PMD in 1 week  Avoid high carbohydrate diet.   Increase activity slowly    Complete by:  As directed        Medication List    STOP taking these medications    azithromycin 250 MG tablet Commonly known as:  ZITHROMAX Z-PAK     TAKE these medications   ARIPiprazole 2 MG tablet Commonly known as:  ABILIFY Take 2 mg by mouth daily.   blood glucose meter kit and supplies Dispense based on patient and insurance preference. Use up to four times daily as directed. (FOR ICD-9 250.00, 250.01).   busPIRone 10 MG tablet Commonly known as:  BUSPAR Take 10 mg by mouth 2 (two) times daily.   glipiZIDE 5 MG tablet Commonly known as:  GLUCOTROL Take 1 tablet (5 mg total) by mouth daily before breakfast.   LORazepam 1 MG tablet Commonly known as:  ATIVAN Take 1 mg by mouth 2 (two) times daily as needed for anxiety.   meloxicam 7.5 MG tablet Commonly known as:  MOBIC Take 7.5 mg by mouth 2 (two) times daily as needed for pain.   metFORMIN 1000 MG tablet Commonly known as:  GLUCOPHAGE Take 1 tablet (1,000 mg total) by mouth 2 (two) times daily with a meal.   PARoxetine 40 MG tablet Commonly known as:  PAXIL Take 40 mg by mouth every morning.   traZODone 50 MG tablet Commonly known as:  DESYREL Take 50 mg by mouth at bedtime.   triamterene-hydrochlorothiazide 37.5-25 MG capsule Commonly known as:  DYAZIDE Take 1 capsule by mouth daily.      Follow-up Information    OSEI-BONSU,GEORGE, MD. Schedule an appointment as soon as possible for a visit in 3 day(s).   Specialty:  Internal Medicine Contact information: [address] [place] [address] [phone]          No Known Allergies  Consultations:  None   Procedures/Studies: Dg Chest 1 View  Result Date: 08/19/2016 CLINICAL DATA:  Diabetic ketoacidosis EXAM: CHEST 1 VIEW COMPARISON:  09/26/2015 FINDINGS: Shallow inspiration with elevation of the left hemidiaphragm. Normal heart size and pulmonary vascularity. No focal airspace disease or consolidation in the lungs. No blunting of costophrenic angles. No pneumothorax. Mediastinal contours appear intact. No  change since prior study. IMPRESSION: Chronic elevation of left hemidiaphragm. No evidence of active pulmonary disease. Electronically Signed   By: Lucienne Capers M.D.   On: 08/19/2016 22:17     Discharge Exam: Vitals:   08/20/16 2127 08/21/16 0454  BP: (!) 154/96 111/75  Pulse: 78 66  Resp: 17 17  Temp: 98.4 F (36.9 C) 97.8 F (36.6 C)   Vitals:   08/20/16 1231 08/20/16 1441 08/20/16 2127 08/21/16 0454  BP:  117/72 (!) 154/96 111/75  Pulse:  80 78 66  Resp:  16 17 17   Temp: 98.1 F (36.7 C) 97.5 F (36.4 C) 98.4 F (36.9 C) 97.8 F (36.6 C)  TempSrc: Oral Oral Oral Oral  SpO2:  98%  100% 100%  Weight:  71.9 kg (158 lb 8.2 oz)    Height:  5' 2"  (1.575 m)      General: Pt is alert, awake, not in acute distress Cardiovascular: RRR, S1/S2 +, no rubs, no gallops Respiratory: CTA bilaterally, no wheezing, no rhonchi Abdominal: Soft, NT, ND, bowel sounds + Extremities: no edema, no cyanosis   The results of significant diagnostics from this hospitalization (including imaging, microbiology, ancillary and laboratory) are listed below for reference.     Microbiology: Recent Results (from the past 240 hour(s))  MRSA PCR Screening     Status: None   Collection Time: 08/19/16  9:15 PM  Result Value Ref Range Status   MRSA by PCR NEGATIVE NEGATIVE Final    Comment:        The GeneXpert MRSA Assay (FDA approved for NASAL specimens only), is one component of a comprehensive MRSA colonization surveillance program. It is not intended to diagnose MRSA infection nor to guide or monitor treatment for MRSA infections.      Labs: BNP (last 3 results) No results for input(s): BNP in the last 8760 hours. Basic Metabolic Panel:  Recent Labs Lab 08/20/16 0049 08/20/16 0403 08/20/16 1101 08/20/16 1421 08/21/16 0555  NA 139 138 135 135 137  K 3.2* 3.0* 3.2* 3.1* 3.4*  CL 110 105 102 102 103  CO2 26 26 25 26 25   GLUCOSE 150* 242* 324* 236* 312*  BUN 16 14 13 14 8    CREATININE 0.92 0.91 0.88 0.90 0.76  CALCIUM 8.7* 8.7* 8.6* 8.9 9.1   Liver Function Tests:  Recent Labs Lab 08/20/16 0049  AST 22  ALT 18  ALKPHOS 44  BILITOT 1.1  PROT 5.8*  ALBUMIN 3.3*   No results for input(s): LIPASE, AMYLASE in the last 168 hours. No results for input(s): AMMONIA in the last 168 hours. CBC:  Recent Labs Lab 08/19/16 1615 08/20/16 1101  WBC 12.7* 9.0  NEUTROABS  --  4.7  HGB 14.2 11.0*  HCT 41.7 34.3*  MCV 83.1 84.9  PLT 320 238   Cardiac Enzymes:  Recent Labs Lab 08/19/16 2240 08/20/16 0403 08/20/16 1101  TROPONINI <0.03 <0.03 <0.03   BNP: Invalid input(s): POCBNP CBG:  Recent Labs Lab 08/20/16 1239 08/20/16 1712 08/20/16 2333 08/21/16 0606 08/21/16 0800  GLUCAP 274* 227* 277* 310* 284*   D-Dimer No results for input(s): DDIMER in the last 72 hours. Hgb A1c  Recent Labs  08/20/16 0403  HGBA1C 8.0*   Lipid Profile  Recent Labs  08/20/16 0403  CHOL 140  HDL 40*  LDLCALC 90  TRIG 52  CHOLHDL 3.5   Thyroid function studies No results for input(s): TSH, T4TOTAL, T3FREE, THYROIDAB in the last 72 hours.  Invalid input(s): FREET3 Anemia work up No results for input(s): VITAMINB12, FOLATE, FERRITIN, TIBC, IRON, RETICCTPCT in the last 72 hours. Urinalysis    Component Value Date/Time   COLORURINE YELLOW 08/19/2016 [place] 08/19/2016 1515   LABSPEC 1.027 08/19/2016 1515   PHURINE 5.0 08/19/2016 1515   GLUCOSEU >1000 (A) 08/19/2016 1515   HGBUR NEGATIVE 08/19/2016 1515   BILIRUBINUR NEGATIVE 08/19/2016 1515   KETONESUR >80 (A) 08/19/2016 1515   PROTEINUR NEGATIVE 08/19/2016 1515   UROBILINOGEN 1.0 07/13/2014 1240   NITRITE NEGATIVE 08/19/2016 1515   LEUKOCYTESUR NEGATIVE 08/19/2016 1515   Sepsis Labs Invalid input(s): PROCALCITONIN,  WBC,  LACTICIDVEN Microbiology Recent Results (from the past 240 hour(s))  MRSA PCR Screening     Status:  None   Collection Time: 08/19/16  9:15 PM  Result  Value Ref Range Status   MRSA by PCR NEGATIVE NEGATIVE Final    Comment:        The GeneXpert MRSA Assay (FDA approved for NASAL specimens only), is one component of a comprehensive MRSA colonization surveillance program. It is not intended to diagnose MRSA infection nor to guide or monitor treatment for MRSA infections.    Time coordinating discharge: Over 30 minutes  SIGNED:  Chipper Oman, MD  [facility] 08/21/2016, 2:25 PM Pager   If 7PM-7AM, please contact night-coverage [email or web address] Password [email or web address]

## 2016-08-21 NOTE — Progress Notes (Signed)
Inpatient Diabetes Program Recommendations  AACE/ADA: New Consensus Statement on Inpatient Glycemic Control (2015)  Target Ranges:  Prepandial:   less than 140 mg/dL      Peak postprandial:   less than 180 mg/dL (1-2 hours)      Critically ill patients:  140 - 180 mg/dL   Lab Results  Component Value Date   GLUCAP 284 (H) 08/21/2016   HGBA1C 8.0 (H) 08/20/2016   Spoke with patient's sister about new diabetes diagnosis.  Discussed A1C results (8% on 08/20/16) and explained what an A1C is. Discussed basic pathophysiology of DM Type 2, basic home care, importance of checking CBGs and maintaining good CBG control to prevent long-term and short-term complications. Reviewed glucose and A1C goals and explained that patient will need to continue to  Reviewed signs and symptoms of hyperglycemia and hypoglycemia along with treatment for both. Discussed impact of nutrition, exercise, stress, sickness, and medications on diabetes control. Reviewed Living Well with diabetes booklet and encouraged patient to read through entire book. Informed patient's sister that she will be prescribed Glipizide Daily and Metformin BID.  Went over side effects of metformin and to cover that medications need to be taken with food. Asked patient's sister to check the patient's glucose twice a day (fasting and alternating second check to be throughout the day). Patient's sister verbalized understanding of information discussed.  She knows to make a PCP appointment and to call if glucose is greater than 180 or less than 80 for medication adjustments. RN provided continuing education at bedside. Dietitian came to see patient as well while I was in the room to discuss food options.   Thanks, Christena DeemShannon Keelin Neville RN, MSN, [facility] Inpatient Diabetes Coordinator Team Pager [phone] (8a-5p)

## 2016-08-21 NOTE — Plan of Care (Signed)
Problem: Food- and Nutrition-Related Knowledge Deficit (NB-1.1) Goal: Nutrition education Formal process to instruct or train a patient/client in a skill or to impart knowledge to help patients/clients voluntarily manage or modify food choices and eating behavior to maintain or improve health. Outcome: Adequate for Discharge  RD consulted for nutrition education regarding diabetes.   Lab Results  Component Value Date   HGBA1C 8.0 (H) 08/20/2016   Education session done with DM coordinator. Pt POA had extensive questions regarding diet and is very interested in making lifestyle changes. POA reports pt has been adding more vegetables to diet and stopped frying foods. Discussed ways pt could decrease carbohydrates by avoiding sugary beverages and watching portion sizes.   Case also discussed with RN.  RD provided "Carbohydrate Counting for People with Diabetes" handout from the Academy of [employer] Dietetics. Discussed different food groups and their effects on blood sugar, emphasizing carbohydrate-containing foods. Provided list of carbohydrates and recommended serving sizes of common foods.  Discussed importance of controlled and consistent carbohydrate intake throughout the day. Provided examples of ways to balance meals/snacks and encouraged intake of high-fiber, whole grain complex carbohydrates. Teach back method used.  Expect fair to good compliance.  Body mass index is 28.99 kg/m. Pt meets criteria for overweight based on current BMI.  Current diet order is Carb Modified, patient is consuming approximately 75-100% of meals at this time. Labs and medications reviewed. No further nutrition interventions warranted at this time. RD contact information provided. If additional nutrition issues arise, please re-consult RD.  Millena Callins A. Mayford KnifeWilliams, RD, LDN, CDE Pager: [phone] After hours Pager: [phone]

## 2016-08-23 DIAGNOSIS — E119 Type 2 diabetes mellitus without complications: Secondary | ICD-10-CM | POA: Diagnosis not present

## 2016-09-08 DIAGNOSIS — R5383 Other fatigue: Secondary | ICD-10-CM | POA: Diagnosis not present

## 2016-09-08 DIAGNOSIS — R0602 Shortness of breath: Secondary | ICD-10-CM | POA: Diagnosis not present

## 2016-09-08 DIAGNOSIS — Z0000 Encounter for general adult medical examination without abnormal findings: Secondary | ICD-10-CM | POA: Diagnosis not present

## 2016-09-08 DIAGNOSIS — E1165 Type 2 diabetes mellitus with hyperglycemia: Secondary | ICD-10-CM | POA: Diagnosis not present

## 2016-09-08 DIAGNOSIS — Z1389 Encounter for screening for other disorder: Secondary | ICD-10-CM | POA: Diagnosis not present

## 2016-09-08 DIAGNOSIS — E559 Vitamin D deficiency, unspecified: Secondary | ICD-10-CM | POA: Diagnosis not present

## 2016-09-11 DIAGNOSIS — E119 Type 2 diabetes mellitus without complications: Secondary | ICD-10-CM | POA: Diagnosis not present

## 2016-09-27 DIAGNOSIS — E119 Type 2 diabetes mellitus without complications: Secondary | ICD-10-CM | POA: Diagnosis not present

## 2016-09-27 DIAGNOSIS — I10 Essential (primary) hypertension: Secondary | ICD-10-CM | POA: Diagnosis not present

## 2016-09-27 DIAGNOSIS — E559 Vitamin D deficiency, unspecified: Secondary | ICD-10-CM | POA: Diagnosis not present

## 2016-09-27 DIAGNOSIS — N183 Chronic kidney disease, stage 3 (moderate): Secondary | ICD-10-CM | POA: Diagnosis not present

## 2016-10-13 DIAGNOSIS — Z780 Asymptomatic menopausal state: Secondary | ICD-10-CM | POA: Diagnosis not present

## 2016-10-13 DIAGNOSIS — N183 Chronic kidney disease, stage 3 (moderate): Secondary | ICD-10-CM | POA: Diagnosis not present

## 2016-11-08 DIAGNOSIS — H25013 Cortical age-related cataract, bilateral: Secondary | ICD-10-CM | POA: Diagnosis not present

## 2016-11-08 DIAGNOSIS — H2513 Age-related nuclear cataract, bilateral: Secondary | ICD-10-CM | POA: Diagnosis not present

## 2016-11-08 DIAGNOSIS — H31012 Macula scars of posterior pole (postinflammatory) (post-traumatic), left eye: Secondary | ICD-10-CM | POA: Diagnosis not present

## 2016-11-08 DIAGNOSIS — E119 Type 2 diabetes mellitus without complications: Secondary | ICD-10-CM | POA: Diagnosis not present

## 2018-01-29 IMAGING — CR DG CHEST 1V
1 series · 1 of 1 positions shown · non-contrast
Comparison: 09/26/2015

CLINICAL DATA: Diabetic ketoacidosis

EXAM:
CHEST 1 VIEW

[AP]
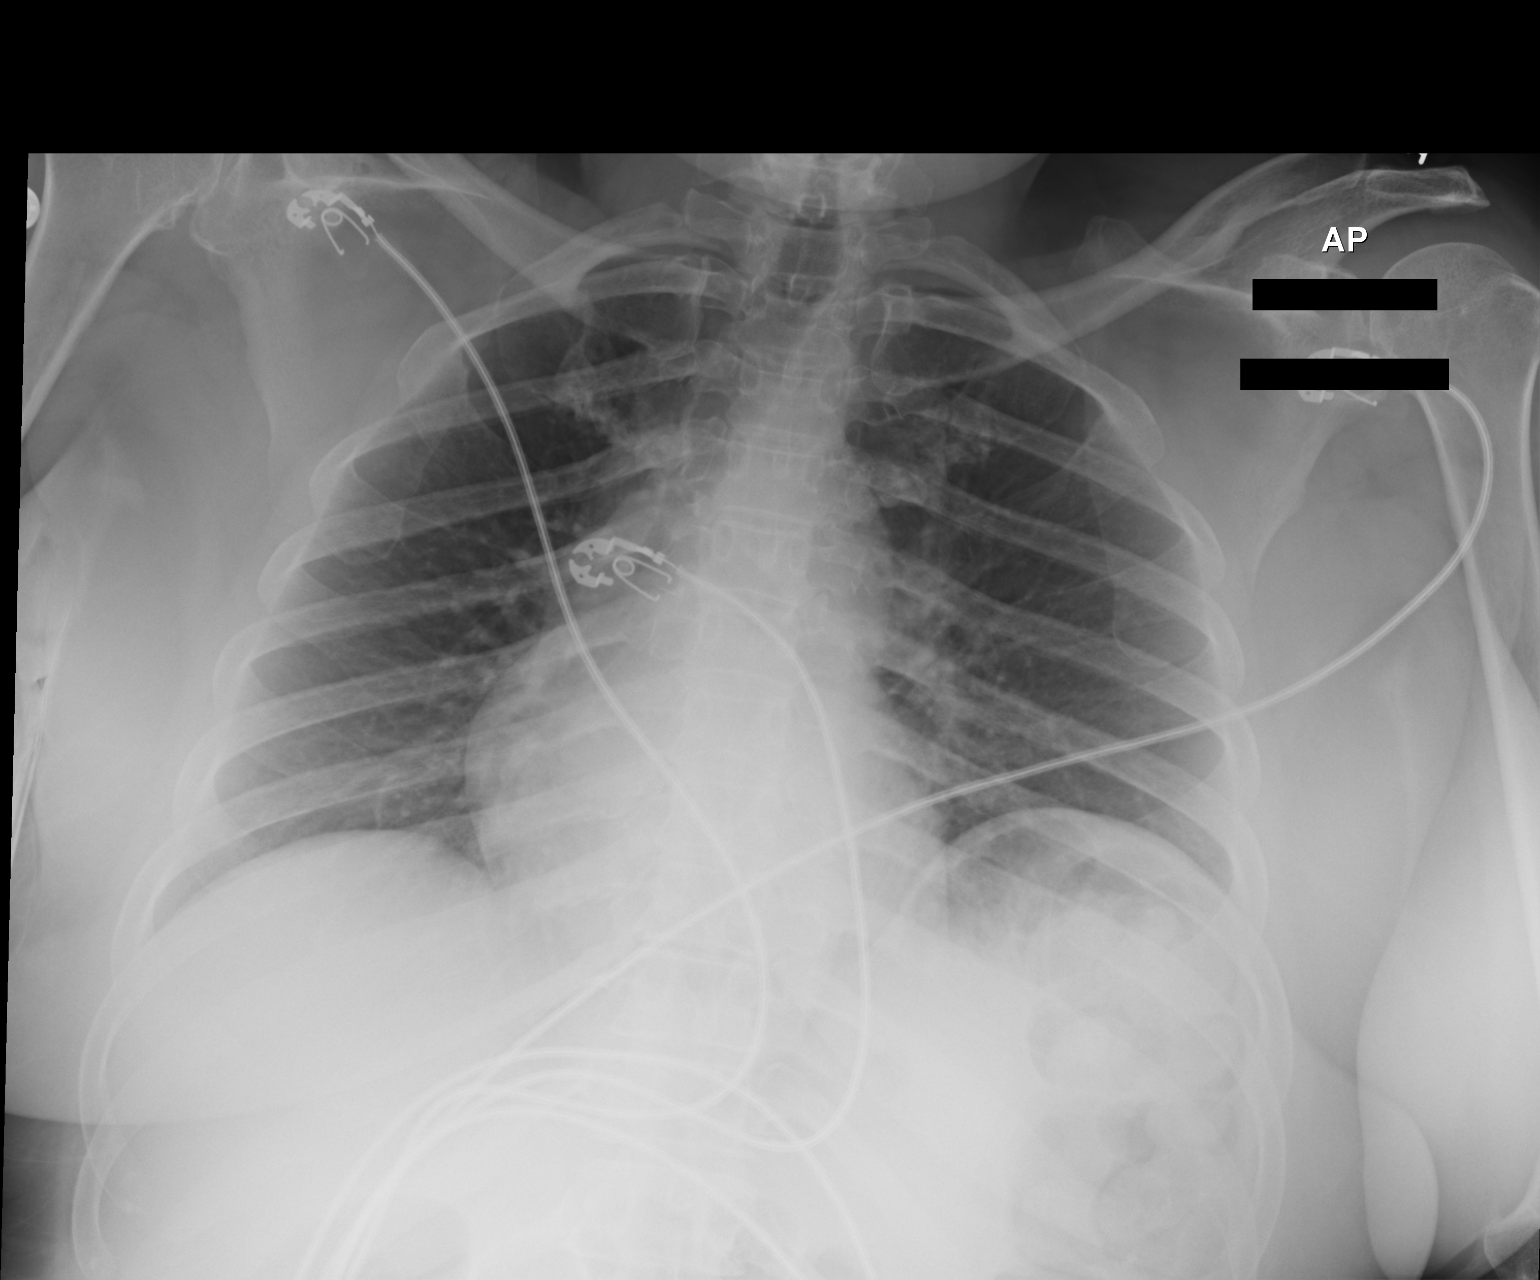

[1 of 1 positions shown; findings below may reference images not displayed]

FINDINGS: Shallow inspiration with elevation of the left hemidiaphragm. Normal
heart size and pulmonary vascularity. No focal airspace disease or
consolidation in the lungs. No blunting of costophrenic angles. No
pneumothorax. Mediastinal contours appear intact. No change since
prior study.
IMPRESSION: Chronic elevation of left hemidiaphragm. No evidence of active
pulmonary disease.

## 2020-05-19 ENCOUNTER — Other Ambulatory Visit: Payer: Self-pay

## 2020-05-19 ENCOUNTER — Emergency Department (HOSPITAL_BASED_OUTPATIENT_CLINIC_OR_DEPARTMENT_OTHER)
Admission: EM | Admit: 2020-05-19 | Discharge: 2020-05-19 | Disposition: A | Discharge status: Home / Self Care | Payer: Medicare Other | Attending: Emergency Medicine | Admitting: Emergency Medicine

## 2020-05-19 ENCOUNTER — Encounter (HOSPITAL_BASED_OUTPATIENT_CLINIC_OR_DEPARTMENT_OTHER): Payer: Self-pay | Admitting: *Deleted

## 2020-05-19 DIAGNOSIS — Z7984 Long term (current) use of oral hypoglycemic drugs: Secondary | ICD-10-CM | POA: Diagnosis not present

## 2020-05-19 DIAGNOSIS — E1110 Type 2 diabetes mellitus with ketoacidosis without coma: Secondary | ICD-10-CM | POA: Diagnosis not present

## 2020-05-19 DIAGNOSIS — R7309 Other abnormal glucose: Secondary | ICD-10-CM | POA: Diagnosis present

## 2020-05-19 DIAGNOSIS — Z79899 Other long term (current) drug therapy: Secondary | ICD-10-CM | POA: Insufficient documentation

## 2020-05-19 DIAGNOSIS — I10 Essential (primary) hypertension: Secondary | ICD-10-CM | POA: Diagnosis not present

## 2020-05-19 DIAGNOSIS — R739 Hyperglycemia, unspecified: Secondary | ICD-10-CM

## 2020-05-19 DIAGNOSIS — E1165 Type 2 diabetes mellitus with hyperglycemia: Secondary | ICD-10-CM | POA: Insufficient documentation

## 2020-05-19 HISTORY — DX: Type 2 diabetes mellitus without complications: E11.9

## 2020-05-19 LAB — CBC WITH DIFFERENTIAL/PLATELET
Abs Immature Granulocytes: 0.03 10*3/uL (ref 0.00–0.07)
Basophils Absolute: 0 10*3/uL (ref 0.0–0.1)
Basophils Relative: 0 %
Eosinophils Absolute: 0 10*3/uL (ref 0.0–0.5)
Eosinophils Relative: 1 %
HCT: 35.5 % — ABNORMAL LOW (ref 36.0–46.0)
Hemoglobin: 11.6 g/dL — ABNORMAL LOW (ref 12.0–15.0)
Immature Granulocytes: 0 %
Lymphocytes Relative: 38 %
Lymphs Abs: 2.6 10*3/uL (ref 0.7–4.0)
MCH: 28.6 pg (ref 26.0–34.0)
MCHC: 32.7 g/dL (ref 30.0–36.0)
MCV: 87.7 fL (ref 80.0–100.0)
Monocytes Absolute: 0.6 10*3/uL (ref 0.1–1.0)
Monocytes Relative: 9 %
Neutro Abs: 3.5 10*3/uL (ref 1.7–7.7)
Neutrophils Relative %: 52 %
Platelets: 317 10*3/uL (ref 150–400)
RBC: 4.05 MIL/uL (ref 3.87–5.11)
RDW: 13.4 % (ref 11.5–15.5)
WBC: 6.8 10*3/uL (ref 4.0–10.5)
nRBC: 0 % (ref 0.0–0.2)

## 2020-05-19 LAB — COMPREHENSIVE METABOLIC PANEL
ALT: 33 U/L (ref 0–44)
AST: 16 U/L (ref 15–41)
Albumin: 4 g/dL (ref 3.5–5.0)
Alkaline Phosphatase: 55 U/L (ref 38–126)
Anion gap: 10 (ref 5–15)
BUN: 10 mg/dL (ref 6–20)
CO2: 25 mmol/L (ref 22–32)
Calcium: 9.1 mg/dL (ref 8.9–10.3)
Chloride: 96 mmol/L — ABNORMAL LOW (ref 98–111)
Creatinine, Ser: 0.74 mg/dL (ref 0.44–1.00)
GFR calc Af Amer: >60 mL/min (ref >60)
GFR calc non Af Amer: >60 mL/min (ref >60)
Glucose, Bld: 363 mg/dL — ABNORMAL HIGH (ref 70–99)
Potassium: 3.7 mmol/L (ref 3.5–5.1)
Sodium: 131 mmol/L — ABNORMAL LOW (ref 135–145)
Total Bilirubin: 0.6 mg/dL (ref 0.3–1.2)
Total Protein: 6.7 g/dL (ref 6.5–8.1)

## 2020-05-19 LAB — CBG MONITORING, ED
Glucose-Capillary: 366 mg/dL — ABNORMAL HIGH (ref 70–99)
Glucose-Capillary: 281 mg/dL — ABNORMAL HIGH (ref 70–99)

## 2020-05-19 MED ORDER — SODIUM CHLORIDE 0.9 % IV BOLUS
1000.0000 mL | Freq: Once | INTRAVENOUS | Status: AC
  Administered 2020-05-19: 1000 mL via INTRAVENOUS

## 2020-05-19 NOTE — ED Notes (Signed)
PT's guardian stated " You guys need to hurry up and let us go home, she is better and I need to get home". Informed guardian I will inform MD. PO fluids and blankets given to pt and guardian.

## 2020-05-19 NOTE — ED Provider Notes (Signed)
[facility] EMERGENCY DEPARTMENT Provider Note   CSN: [id number] Arrival date & time: 05/19/20  1654     History Chief Complaint  Patient presents with  . Hyperglycemia    Elleah Hemsley is a 59 y.o. female history diabetes, hypertension, here presenting with elevated blood sugar.  Patient was admitted to [facility] about a month ago for uncontrolled diabetes.  Patient is started on Metformin and Januvia.  Patient's blood sugar has been fluctuating.  Per the sister, her blood sugar was over 500 today.  Patient has no vomiting or fevers or abdominal pain.  Patient is brought here for further evaluation.  The history is provided by the patient.       Past Medical History:  Diagnosis Date  . Diabetes mellitus without complication ([place])   . Hypertension   . Mental retardation     Patient Active Problem List   Diagnosis Date Noted  . Type 2 diabetes mellitus with ketoacidosis without coma, without long-term current use of insulin ([place])   . Essential hypertension   . DKA (diabetic ketoacidoses) ([place]) 08/19/2016  . Developmental delay disorder 08/19/2016    Past Surgical History:  Procedure Laterality Date  . ABDOMINAL HYSTERECTOMY    . BACK SURGERY    . CERVICAL SPINE SURGERY       OB History   No obstetric history on file.     History reviewed. No pertinent family history.  Social History   Tobacco Use  . Smoking status: Never Smoker  . Smokeless tobacco: Never Used  Substance Use Topics  . Alcohol use: No  . Drug use: No    Home Medications Prior to Admission medications   Medication Sig Start Date End Date Taking? Authorizing Provider  ARIPiprazole (ABILIFY) 2 MG tablet Take 2 mg by mouth daily.    [provider]  blood glucose meter kit and supplies Dispense based on patient and insurance preference. Use up to four times daily as directed. (FOR ICD-9 250.00, 250.01). 08/21/16   Patrecia Pour, Christean Grief, MD  busPIRone (BUSPAR) 10 MG tablet  Take 10 mg by mouth 2 (two) times daily.    [provider]  glipiZIDE (GLUCOTROL) 5 MG tablet Take 1 tablet (5 mg total) by mouth daily before breakfast. 08/21/16   Patrecia Pour, Christean Grief, MD  LORazepam (ATIVAN) 1 MG tablet Take 1 mg by mouth 2 (two) times daily as needed for anxiety.    [provider]  meloxicam (MOBIC) 7.5 MG tablet Take 7.5 mg by mouth 2 (two) times daily as needed for pain.  08/08/16   [provider]  metFORMIN (GLUCOPHAGE) 1000 MG tablet Take 1 tablet (1,000 mg total) by mouth 2 (two) times daily with a meal. 08/21/16   Patrecia Pour, Christean Grief, MD  PARoxetine (PAXIL) 40 MG tablet Take 40 mg by mouth every morning.    [provider]  traZODone (DESYREL) 50 MG tablet Take 50 mg by mouth at bedtime.     [provider]  triamterene-hydrochlorothiazide (DYAZIDE) 37.5-25 MG capsule Take 1 capsule by mouth daily. 08/03/16   [provider]    Allergies    Patient has no known allergies.  Review of Systems   Review of Systems  Constitutional: Negative for fever.  Gastrointestinal: Negative for abdominal pain.  All other systems reviewed and are negative.   Physical Exam Updated Vital Signs BP (!) 161/90   Pulse 75   Temp 98.5 F (36.9 C) (Oral)   Resp 16  Ht _0  (1.575 m)   Wt 45.8 kg   LMP  (LMP Unknown)   SpO2 97%   BMI 18.47 kg/m   Physical Exam Vitals and nursing note reviewed.  HENT:     Head: Normocephalic.     Nose: Nose normal.     Mouth/Throat:     Comments: MM slightly dry  Eyes:     Extraocular Movements: Extraocular movements intact.     Pupils: Pupils are equal, round, and reactive to light.  Cardiovascular:     Rate and Rhythm: Normal rate and regular rhythm.     Pulses: Normal pulses.     Heart sounds: Normal heart sounds.  Pulmonary:     Effort: Pulmonary effort is normal.     Breath sounds: Normal breath sounds.  Abdominal:     General: Abdomen is flat.     Palpations: Abdomen is  soft.  Musculoskeletal:        General: Normal range of motion.     Cervical back: Normal range of motion.  Skin:    General: Skin is warm.     Capillary Refill: Capillary refill takes less than 2 seconds.  Neurological:     General: No focal deficit present.     Mental Status: She is alert.  Psychiatric:        Mood and Affect: Mood normal.     ED Results / Procedures / Treatments   Labs (all labs ordered are listed, but only abnormal results are displayed) Labs Reviewed  CBC WITH DIFFERENTIAL/PLATELET - Abnormal; Notable for the following components:      Result Value   Hemoglobin 11.6 (*)    HCT 35.5 (*)    All other components within normal limits  COMPREHENSIVE METABOLIC PANEL - Abnormal; Notable for the following components:   Sodium 131 (*)    Chloride 96 (*)    Glucose, Bld 363 (*)    All other components within normal limits  CBG MONITORING, ED - Abnormal; Notable for the following components:   Glucose-Capillary 366 (*)    All other components within normal limits  CBG MONITORING, ED - Abnormal; Notable for the following components:   Glucose-Capillary 281 (*)    All other components within normal limits    EKG None  Radiology No results found.  Procedures Procedures (including critical care time)  Medications Ordered in ED Medications  sodium chloride 0.9 % bolus 1,000 mL ( Intravenous Stopped 05/19/20 1912)    ED Course  I have reviewed the triage vital signs and the nursing notes.  Pertinent labs & imaging results that were available during my care of the patient were reviewed by me and considered in my medical decision making (see chart for details).    MDM Rules/Calculators/A&P                          Marija Calamari is a 59 y.o. female here presenting with hyperglycemia.  Patient was recently admitted for DKA.  Patient is compliant with her medicines.  Patient blood sugar was apparently 500 at home but was 369 in triage.  She has no signs of  DKA currently.  Will check CBC, CMP, will hydrate patient and reassess blood sugar.  7:35 PM Sugar was 366 with normal anion gap.  After a liter bolus, her blood sugar went down to 281.  Stable for discharge.  Final Clinical Impression(s) / ED Diagnoses Final diagnoses:  None  Rx / DC Orders ED Discharge Orders    None       Drenda Freeze, MD 05/19/20 [id number]

## 2020-05-19 NOTE — Discharge Instructions (Signed)
Continue current medicines for diabetes.  Talk to your doctor about starting insulin.  Follow-up with your doctor next week.  Return to ER if your blood sugars greater than 600, vomiting, fever, severe abdominal pain.

## 2020-05-19 NOTE — ED Triage Notes (Signed)
Guardian states pt taking off DM meds by PMD , elevated BS 1 month . Recent admit to hospital in [place] for same, BS today 500

## 2020-07-01 ENCOUNTER — Other Ambulatory Visit: Payer: Self-pay

## 2020-07-01 ENCOUNTER — Encounter (HOSPITAL_BASED_OUTPATIENT_CLINIC_OR_DEPARTMENT_OTHER): Payer: Self-pay

## 2020-07-01 ENCOUNTER — Emergency Department (HOSPITAL_BASED_OUTPATIENT_CLINIC_OR_DEPARTMENT_OTHER)
Admission: EM | Admit: 2020-07-01 | Discharge: 2020-07-01 | Disposition: A | Discharge status: Home / Self Care | Payer: Medicare Other | Attending: Emergency Medicine | Admitting: Emergency Medicine

## 2020-07-01 DIAGNOSIS — R410 Disorientation, unspecified: Secondary | ICD-10-CM | POA: Insufficient documentation

## 2020-07-01 DIAGNOSIS — Z5321 Procedure and treatment not carried out due to patient leaving prior to being seen by health care provider: Secondary | ICD-10-CM | POA: Diagnosis not present

## 2020-07-01 LAB — URINALYSIS, ROUTINE W REFLEX MICROSCOPIC
Bilirubin Urine: NEGATIVE
Glucose, UA: NEGATIVE mg/dL
Hgb urine dipstick: NEGATIVE
Ketones, ur: 15 mg/dL — AB
Nitrite: NEGATIVE
Protein, ur: NEGATIVE mg/dL
Specific Gravity, Urine: >1.03 — ABNORMAL HIGH (ref 1.005–1.030)
pH: 5.5 (ref 5.0–8.0)

## 2020-07-01 LAB — URINALYSIS, MICROSCOPIC (REFLEX)

## 2020-07-01 LAB — CBG MONITORING, ED: Glucose-Capillary: 93 mg/dL (ref 70–99)

## 2020-07-01 NOTE — ED Triage Notes (Signed)
Pt arrives with sister who is caregiver who reports pt has been acting more confused at home. States that she thinks she may have a UTI and gave pt some antibiotics that she had at home. CBG in triage is 93.

## 2020-07-02 ENCOUNTER — Emergency Department (HOSPITAL_BASED_OUTPATIENT_CLINIC_OR_DEPARTMENT_OTHER)
Admission: EM | Admit: 2020-07-02 | Discharge: 2020-07-02 | Disposition: A | Discharge status: Home / Self Care | Payer: Medicare Other | Attending: Emergency Medicine | Admitting: Emergency Medicine

## 2020-07-02 ENCOUNTER — Encounter (HOSPITAL_BASED_OUTPATIENT_CLINIC_OR_DEPARTMENT_OTHER): Payer: Self-pay | Admitting: *Deleted

## 2020-07-02 DIAGNOSIS — E876 Hypokalemia: Secondary | ICD-10-CM

## 2020-07-02 DIAGNOSIS — R4182 Altered mental status, unspecified: Secondary | ICD-10-CM | POA: Insufficient documentation

## 2020-07-02 DIAGNOSIS — E1110 Type 2 diabetes mellitus with ketoacidosis without coma: Secondary | ICD-10-CM | POA: Insufficient documentation

## 2020-07-02 DIAGNOSIS — Z7984 Long term (current) use of oral hypoglycemic drugs: Secondary | ICD-10-CM | POA: Diagnosis not present

## 2020-07-02 DIAGNOSIS — Z8639 Personal history of other endocrine, nutritional and metabolic disease: Secondary | ICD-10-CM

## 2020-07-02 DIAGNOSIS — R4589 Other symptoms and signs involving emotional state: Secondary | ICD-10-CM

## 2020-07-02 DIAGNOSIS — I10 Essential (primary) hypertension: Secondary | ICD-10-CM | POA: Diagnosis not present

## 2020-07-02 DIAGNOSIS — Z79899 Other long term (current) drug therapy: Secondary | ICD-10-CM | POA: Insufficient documentation

## 2020-07-02 LAB — URINALYSIS, ROUTINE W REFLEX MICROSCOPIC
Bilirubin Urine: NEGATIVE
Glucose, UA: NEGATIVE mg/dL
Hgb urine dipstick: NEGATIVE
Ketones, ur: >80 mg/dL — AB
Nitrite: NEGATIVE
Protein, ur: NEGATIVE mg/dL
Specific Gravity, Urine: 1.02 (ref 1.005–1.030)
pH: 5.5 (ref 5.0–8.0)

## 2020-07-02 LAB — COMPREHENSIVE METABOLIC PANEL
ALT: 26 U/L (ref 0–44)
AST: 22 U/L (ref 15–41)
Albumin: 5.2 g/dL — ABNORMAL HIGH (ref 3.5–5.0)
Alkaline Phosphatase: 66 U/L (ref 38–126)
Anion gap: 14 (ref 5–15)
BUN: 11 mg/dL (ref 6–20)
CO2: 26 mmol/L (ref 22–32)
Calcium: 9.8 mg/dL (ref 8.9–10.3)
Chloride: 99 mmol/L (ref 98–111)
Creatinine, Ser: 0.64 mg/dL (ref 0.44–1.00)
GFR calc Af Amer: >60 mL/min (ref >60)
GFR calc non Af Amer: >60 mL/min (ref >60)
Glucose, Bld: 139 mg/dL — ABNORMAL HIGH (ref 70–99)
Potassium: 3.4 mmol/L — ABNORMAL LOW (ref 3.5–5.1)
Sodium: 139 mmol/L (ref 135–145)
Total Bilirubin: 1.3 mg/dL — ABNORMAL HIGH (ref 0.3–1.2)
Total Protein: 8.9 g/dL — ABNORMAL HIGH (ref 6.5–8.1)

## 2020-07-02 LAB — URINALYSIS, MICROSCOPIC (REFLEX)

## 2020-07-02 LAB — CBC
HCT: 41.8 % (ref 36.0–46.0)
Hemoglobin: 13.3 g/dL (ref 12.0–15.0)
MCH: 28 pg (ref 26.0–34.0)
MCHC: 31.8 g/dL (ref 30.0–36.0)
MCV: 88 fL (ref 80.0–100.0)
Platelets: 375 10*3/uL (ref 150–400)
RBC: 4.75 MIL/uL (ref 3.87–5.11)
RDW: 13 % (ref 11.5–15.5)
WBC: 5.6 10*3/uL (ref 4.0–10.5)
nRBC: 0 % (ref 0.0–0.2)

## 2020-07-02 LAB — CBG MONITORING, ED: Glucose-Capillary: 132 mg/dL — ABNORMAL HIGH (ref 70–99)

## 2020-07-02 MED ORDER — POTASSIUM CHLORIDE CRYS ER 20 MEQ PO TBCR
20.0000 meq | EXTENDED_RELEASE_TABLET | Freq: Once | ORAL | Status: AC
  Administered 2020-07-02: 20 meq via ORAL
  Filled 2020-07-02: qty 1

## 2020-07-02 NOTE — Discharge Instructions (Addendum)
It was our pleasure to provide your ER care today - we hope that you feel better.  From today's lab tests, your potassium level is slightly low (3.4) - eat plenty of fruits and vegetables, and follow up with your doctor.   It is not entirely clear what is causing the intermittent fidgety behavior - it may possible related to medications, or anxiety - follow up with your primary care doctor in the next 1-2 weeks - call office to arrange appointment. Also have blood pressure rechecked then, as it is mildly high today.    Return to ER if worse, new symptoms, fevers, new or severe pain, trouble breathing, vomiting, or other concern.

## 2020-07-02 NOTE — ED Triage Notes (Signed)
Pt sister who is poa and caregiver brings pt to ed with concerns of "making movements with her hands over the last week", per sister pt is alert and oriented to baseline, pleasantly confused. Pt sister reports she brought pt to ed for same sx yesterday, but she (sister) had to have a bm so they left and returned today. No new or change in symptoms today per sister.

## 2020-07-02 NOTE — ED Notes (Signed)
Pt on monitor 

## 2020-07-02 NOTE — ED Provider Notes (Signed)
[facility] EMERGENCY DEPARTMENT Provider Note   CSN: [id number] Arrival date & time: 07/02/20  0847     History Chief Complaint  Patient presents with  . Altered Mental Status    Jocelyn Simmons is a 59 y.o. female.  Pt with hx MR, HTN, DM, presents with caregiver noted patient with periods of unusual behavior in the past couple days. Sister/caregiver notes at times not acting like self in terms of seeming more fidgety that normal, especially with her hands/arms. No seizure activity noted. In past few days, symptoms seem to wax/wane without specific exacerbating or alleviating factors.  Sister indicates has occurred occasionally in past, perhaps once with uti, but specific cause not known. Currently described alertness, functional ability, and movement as being c/w baseline.  Overall, has not appeared acutely ill or sick, or in pain.  Has had normal appetite, normal po intake. No recent change in meds. No trauma/fall. No fever/chills/sweats. At baseline, vocalizes, but not able to have conversation, answer questions, or voice concerns/pain. Does have hx uti, ?similar then. No cough or uri symptoms. No vomiting or diarrhea. No apparent pain or discomfort when going to bathroom. No rash/skin lesions. Pt limited historian, not verbally responsive to questions - level 5 caveat. No unresponsiveness, and/or seizure activity noted.   The history is provided by the patient, a relative and a caregiver. The history is limited by the condition of the patient.  Altered Mental Status Associated symptoms: no fever        Past Medical History:  Diagnosis Date  . Diabetes mellitus without complication ([place])   . Hypertension   . Mental retardation     Patient Active Problem List   Diagnosis Date Noted  . Type 2 diabetes mellitus with ketoacidosis without coma, without long-term current use of insulin ([place])   . Essential hypertension   . DKA (diabetic ketoacidoses) ([place]) 08/19/2016  .  Developmental delay disorder 08/19/2016    Past Surgical History:  Procedure Laterality Date  . ABDOMINAL HYSTERECTOMY    . BACK SURGERY    . CERVICAL SPINE SURGERY       OB History   No obstetric history on file.     History reviewed. No pertinent family history.  Social History   Tobacco Use  . Smoking status: Never Smoker  . Smokeless tobacco: Never Used  Substance Use Topics  . Alcohol use: No  . Drug use: No    Home Medications Prior to Admission medications   Medication Sig Start Date End Date Taking? Authorizing Provider  ARIPiprazole (ABILIFY) 2 MG tablet Take 2 mg by mouth daily.    [provider]  blood glucose meter kit and supplies Dispense based on patient and insurance preference. Use up to four times daily as directed. (FOR ICD-9 250.00, 250.01). 08/21/16   Patrecia Pour, Christean Grief, MD  busPIRone (BUSPAR) 10 MG tablet Take 10 mg by mouth 2 (two) times daily.    [provider]  glipiZIDE (GLUCOTROL) 5 MG tablet Take 1 tablet (5 mg total) by mouth daily before breakfast. 08/21/16   Patrecia Pour, Christean Grief, MD  LORazepam (ATIVAN) 1 MG tablet Take 1 mg by mouth 2 (two) times daily as needed for anxiety.    [provider]  meloxicam (MOBIC) 7.5 MG tablet Take 7.5 mg by mouth 2 (two) times daily as needed for pain.  08/08/16   [provider]  metFORMIN (GLUCOPHAGE) 1000 MG tablet Take 1 tablet (1,000 mg total) by mouth 2 (two) times  daily with a meal. 08/21/16   Patrecia Pour, Christean Grief, MD  PARoxetine (PAXIL) 40 MG tablet Take 40 mg by mouth every morning.    [provider]  traZODone (DESYREL) 50 MG tablet Take 50 mg by mouth at bedtime.     [provider]  triamterene-hydrochlorothiazide (DYAZIDE) 37.5-25 MG capsule Take 1 capsule by mouth daily. 08/03/16   [provider]    Allergies    Patient has no known allergies.  Review of Systems   Review of Systems  Unable to perform ROS: Patient nonverbal    Constitutional: Negative for fever.  level 5 caveat - pt not verbally responsive.      Physical Exam Updated Vital Signs BP (!) 152/96   Pulse 78   Temp 98.4 F (36.9 C)   Ht 1.575 m (5' 2" )   Wt 48.1 kg   LMP  (LMP Unknown)   SpO2 99%   BMI 19.39 kg/m   Physical Exam Vitals and nursing note reviewed.  Constitutional:      Appearance: Normal appearance. She is well-developed.  HENT:     Head: Atraumatic.     Nose: Nose normal.     Mouth/Throat:     Mouth: Mucous membranes are moist.  Eyes:     General: No scleral icterus.    Conjunctiva/sclera: Conjunctivae normal.     Pupils: Pupils are equal, round, and reactive to light.  Neck:     Vascular: No carotid bruit.     Trachea: No tracheal deviation.  Cardiovascular:     Rate and Rhythm: Normal rate and regular rhythm.     Pulses: Normal pulses.     Heart sounds: Normal heart sounds. No murmur heard.  No friction rub. No gallop.   Pulmonary:     Effort: Pulmonary effort is normal. No respiratory distress.     Breath sounds: Normal breath sounds.  Abdominal:     General: Bowel sounds are normal. There is no distension.     Palpations: Abdomen is soft. There is no mass.     Tenderness: There is no abdominal tenderness. There is no guarding.  Genitourinary:    Comments: No cva tenderness.  Musculoskeletal:        General: No swelling or tenderness.     Cervical back: Normal range of motion and neck supple. No rigidity. No muscular tenderness.     Right lower leg: No edema.     Left lower leg: No edema.  Lymphadenopathy:     Cervical: No cervical adenopathy.  Skin:    General: Skin is warm and dry.     Findings: No rash.  Neurological:     Mental Status: She is alert.     Comments: Alert, makes vocalizations. Moves bilateral extremities purposefully. Ambulates to bathroom with steady gait. pts mental status and functional ability currently c/w baseline per caregiver.   Psychiatric:        Mood and Affect:  Mood normal.     ED Results / Procedures / Treatments   Labs (all labs ordered are listed, but only abnormal results are displayed) Results for orders placed or performed during the hospital encounter of 07/02/20  CBC  Result Value Ref Range   WBC 5.6 4.0 - 10.5 K/uL   RBC 4.75 3.87 - 5.11 MIL/uL   Hemoglobin 13.3 12.0 - 15.0 g/dL   HCT 41.8 36 - 46 %   MCV 88.0 80.0 - 100.0 fL   MCH 28.0 26.0 - 34.0 pg  MCHC 31.8 30.0 - 36.0 g/dL   RDW 13.0 11.5 - 15.5 %   Platelets 375 150 - 400 K/uL   nRBC 0.0 0.0 - 0.2 %  Comprehensive metabolic panel  Result Value Ref Range   Sodium 139 135 - 145 mmol/L   Potassium 3.4 (L) 3.5 - 5.1 mmol/L   Chloride 99 98 - 111 mmol/L   CO2 26 22 - 32 mmol/L   Glucose, Bld 139 (H) 70 - 99 mg/dL   BUN 11 6 - 20 mg/dL   Creatinine, Ser 0.64 0.44 - 1.00 mg/dL   Calcium 9.8 8.9 - 10.3 mg/dL   Total Protein 8.9 (H) 6.5 - 8.1 g/dL   Albumin 5.2 (H) 3.5 - 5.0 g/dL   AST 22 15 - 41 U/L   ALT 26 0 - 44 U/L   Alkaline Phosphatase 66 38 - 126 U/L   Total Bilirubin 1.3 (H) 0.3 - 1.2 mg/dL   GFR calc non Af Amer >60 >60 mL/min   GFR calc Af Amer >60 >60 mL/min   Anion gap 14 5 - 15  Urinalysis, Routine w reflex microscopic Urine, Clean Catch  Result Value Ref Range   Color, Urine YELLOW YELLOW   APPearance CLEAR CLEAR   Specific Gravity, Urine 1.020 1.005 - 1.030   pH 5.5 5.0 - 8.0   Glucose, UA NEGATIVE NEGATIVE mg/dL   Hgb urine dipstick NEGATIVE NEGATIVE   Bilirubin Urine NEGATIVE NEGATIVE   Ketones, ur >80 (A) NEGATIVE mg/dL   Protein, ur NEGATIVE NEGATIVE mg/dL   Nitrite NEGATIVE NEGATIVE   Leukocytes,Ua TRACE (A) NEGATIVE  Urinalysis, Microscopic (reflex)  Result Value Ref Range   RBC / HPF 0-5 0 - 5 RBC/hpf   WBC, UA 0-5 0 - 5 WBC/hpf   Bacteria, UA RARE (A) NONE SEEN   Squamous Epithelial / LPF 0-5 0 - 5   WBC Clumps PRESENT    Mucus PRESENT   CBG monitoring, ED  Result Value Ref Range   Glucose-Capillary 132 (H) 70 - 99 mg/dL     EKG EKG Interpretation  Date/Time:  Friday July 02 2020 09:05:14 EDT Ventricular Rate:  77 PR Interval:    QRS Duration: 90 QT Interval:  398 QTC Calculation: 451 R Axis:   39 Text Interpretation: Sinus rhythm Nonspecific ST abnormality Confirmed by Lajean Saver [phone]) on 07/02/2020 9:09:59 AM   Radiology No results found.  Procedures Procedures (including critical care time)  Medications Ordered in ED Medications - No data to display  ED Course  I have reviewed the triage vital signs and the nursing notes.  Pertinent labs & imaging results that were available during my care of the patient were reviewed by me and considered in my medical decision making (see chart for details).    MDM Rules/Calculators/A&P                         CBG 132.   Iv ns. Stat labs.   Reviewed nursing notes and prior charts for additional history.  Labs reviewed/interpreted by me - wbc normal. No uti on ua. k sl low, kcl po. Some ket in urine. Po fluids. Family member reports normal/good po intake.   Recheck pt, alert, content, no tremor or shakes. No consistent or persistent, abnormal movement/movement disorder noted. No seizures noted. ?whether possibly symptoms related to meds - family indicates taking normal meds.   Currently pt alert, content, no distress, calm, no abnormal movement, and appears stable  for d/c.   Rec pcp f/u.  Return precautions provided.     Final Clinical Impression(s) / ED Diagnoses Final diagnoses:  None    Rx / DC Orders ED Discharge Orders    None       Lajean Saver, MD 07/02/20 1157

## 2020-07-02 NOTE — ED Triage Notes (Signed)
Pt sister states she thought pt had a uti, so "borrowed" some antibiotics from a family member and gave her 4 doses since leaving here yesterday. Pt sister educated re: importance of not taking or giving medications not prescribed. Pt sister verbalizes understanding.

## 2020-07-12 ENCOUNTER — Encounter (HOSPITAL_BASED_OUTPATIENT_CLINIC_OR_DEPARTMENT_OTHER): Payer: Self-pay | Admitting: *Deleted

## 2020-07-12 ENCOUNTER — Other Ambulatory Visit: Payer: Self-pay

## 2020-07-12 ENCOUNTER — Emergency Department (HOSPITAL_BASED_OUTPATIENT_CLINIC_OR_DEPARTMENT_OTHER): Payer: Medicare Other

## 2020-07-12 ENCOUNTER — Inpatient Hospital Stay (HOSPITAL_BASED_OUTPATIENT_CLINIC_OR_DEPARTMENT_OTHER)
Admission: EM | Admit: 2020-07-12 | Discharge: 2020-07-17 | DRG: 871 | Disposition: A | Discharge status: Home / Self Care | Payer: Medicare Other | Attending: Family Medicine | Admitting: Family Medicine

## 2020-07-12 DIAGNOSIS — F79 Unspecified intellectual disabilities: Secondary | ICD-10-CM | POA: Diagnosis present

## 2020-07-12 DIAGNOSIS — I152 Hypertension secondary to endocrine disorders: Secondary | ICD-10-CM

## 2020-07-12 DIAGNOSIS — F418 Other specified anxiety disorders: Secondary | ICD-10-CM

## 2020-07-12 DIAGNOSIS — R059 Cough, unspecified: Secondary | ICD-10-CM

## 2020-07-12 DIAGNOSIS — Z8249 Family history of ischemic heart disease and other diseases of the circulatory system: Secondary | ICD-10-CM

## 2020-07-12 DIAGNOSIS — E1169 Type 2 diabetes mellitus with other specified complication: Secondary | ICD-10-CM

## 2020-07-12 DIAGNOSIS — R6250 Unspecified lack of expected normal physiological development in childhood: Secondary | ICD-10-CM | POA: Diagnosis present

## 2020-07-12 DIAGNOSIS — E119 Type 2 diabetes mellitus without complications: Secondary | ICD-10-CM

## 2020-07-12 DIAGNOSIS — E785 Hyperlipidemia, unspecified: Secondary | ICD-10-CM | POA: Diagnosis present

## 2020-07-12 DIAGNOSIS — A419 Sepsis, unspecified organism: Principal | ICD-10-CM | POA: Diagnosis present

## 2020-07-12 DIAGNOSIS — Z20822 Contact with and (suspected) exposure to covid-19: Secondary | ICD-10-CM | POA: Diagnosis present

## 2020-07-12 DIAGNOSIS — Z79899 Other long term (current) drug therapy: Secondary | ICD-10-CM

## 2020-07-12 DIAGNOSIS — K1120 Sialoadenitis, unspecified: Secondary | ICD-10-CM | POA: Diagnosis not present

## 2020-07-12 DIAGNOSIS — E876 Hypokalemia: Secondary | ICD-10-CM

## 2020-07-12 DIAGNOSIS — R05 Cough: Secondary | ICD-10-CM

## 2020-07-12 DIAGNOSIS — E1159 Type 2 diabetes mellitus with other circulatory complications: Secondary | ICD-10-CM

## 2020-07-12 DIAGNOSIS — Z794 Long term (current) use of insulin: Secondary | ICD-10-CM

## 2020-07-12 DIAGNOSIS — R6520 Severe sepsis without septic shock: Secondary | ICD-10-CM | POA: Diagnosis present

## 2020-07-12 DIAGNOSIS — J690 Pneumonitis due to inhalation of food and vomit: Secondary | ICD-10-CM

## 2020-07-12 LAB — CBC WITH DIFFERENTIAL/PLATELET
Abs Immature Granulocytes: 0.11 10*3/uL — ABNORMAL HIGH (ref 0.00–0.07)
Basophils Absolute: 0.1 10*3/uL (ref 0.0–0.1)
Basophils Relative: 0 %
Eosinophils Absolute: 0 10*3/uL (ref 0.0–0.5)
Eosinophils Relative: 0 %
HCT: 38.2 % (ref 36.0–46.0)
Hemoglobin: 12.4 g/dL (ref 12.0–15.0)
Immature Granulocytes: 1 %
Lymphocytes Relative: 5 %
Lymphs Abs: 1 10*3/uL (ref 0.7–4.0)
MCH: 28.4 pg (ref 26.0–34.0)
MCHC: 32.5 g/dL (ref 30.0–36.0)
MCV: 87.4 fL (ref 80.0–100.0)
Monocytes Absolute: 1 10*3/uL (ref 0.1–1.0)
Monocytes Relative: 5 %
Neutro Abs: 17.9 10*3/uL — ABNORMAL HIGH (ref 1.7–7.7)
Neutrophils Relative %: 89 %
Platelets: 340 10*3/uL (ref 150–400)
RBC: 4.37 MIL/uL (ref 3.87–5.11)
RDW: 12.7 % (ref 11.5–15.5)
WBC: 20 10*3/uL — ABNORMAL HIGH (ref 4.0–10.5)
nRBC: 0 % (ref 0.0–0.2)

## 2020-07-12 LAB — URINALYSIS, ROUTINE W REFLEX MICROSCOPIC
Bilirubin Urine: NEGATIVE
Glucose, UA: 250 mg/dL — AB
Hgb urine dipstick: NEGATIVE
Ketones, ur: NEGATIVE mg/dL
Leukocytes,Ua: NEGATIVE
Nitrite: NEGATIVE
Protein, ur: NEGATIVE mg/dL
Specific Gravity, Urine: 1.01 (ref 1.005–1.030)
pH: 5.5 (ref 5.0–8.0)

## 2020-07-12 LAB — COMPREHENSIVE METABOLIC PANEL
ALT: 25 U/L (ref 0–44)
AST: 24 U/L (ref 15–41)
Albumin: 4.4 g/dL (ref 3.5–5.0)
Alkaline Phosphatase: 65 U/L (ref 38–126)
Anion gap: 15 (ref 5–15)
BUN: 11 mg/dL (ref 6–20)
CO2: 23 mmol/L (ref 22–32)
Calcium: 9.2 mg/dL (ref 8.9–10.3)
Chloride: 100 mmol/L (ref 98–111)
Creatinine, Ser: 0.71 mg/dL (ref 0.44–1.00)
GFR calc Af Amer: >60 mL/min (ref >60)
GFR calc non Af Amer: >60 mL/min (ref >60)
Glucose, Bld: 241 mg/dL — ABNORMAL HIGH (ref 70–99)
Potassium: 3.4 mmol/L — ABNORMAL LOW (ref 3.5–5.1)
Sodium: 138 mmol/L (ref 135–145)
Total Bilirubin: 0.8 mg/dL (ref 0.3–1.2)
Total Protein: 7.6 g/dL (ref 6.5–8.1)

## 2020-07-12 LAB — LACTIC ACID, PLASMA
Lactic Acid, Venous: 4.7 mmol/L (ref 0.5–1.9)
Lactic Acid, Venous: 3.6 mmol/L (ref 0.5–1.9)

## 2020-07-12 LAB — SARS CORONAVIRUS 2 BY RT PCR (HOSPITAL ORDER, PERFORMED IN ~~LOC~~ HOSPITAL LAB): SARS Coronavirus 2: NEGATIVE

## 2020-07-12 MED ORDER — VANCOMYCIN HCL 750 MG/150ML IV SOLN
750.0000 mg | INTRAVENOUS | Status: DC
  Administered 2020-07-13 – 2020-07-15 (×3): 750 mg via INTRAVENOUS
  Filled 2020-07-12 (×2): qty 150

## 2020-07-12 MED ORDER — SODIUM CHLORIDE 0.9 % IV BOLUS
1000.0000 mL | Freq: Once | INTRAVENOUS | Status: AC
  Administered 2020-07-12: 1000 mL via INTRAVENOUS

## 2020-07-12 MED ORDER — IOHEXOL 300 MG/ML  SOLN
75.0000 mL | Freq: Once | INTRAMUSCULAR | Status: AC | PRN
  Administered 2020-07-12: 75 mL via INTRAVENOUS

## 2020-07-12 MED ORDER — IPRATROPIUM-ALBUTEROL 0.5-2.5 (3) MG/3ML IN SOLN
3.0000 mL | Freq: Once | RESPIRATORY_TRACT | Status: AC
  Administered 2020-07-12: 3 mL via RESPIRATORY_TRACT
  Filled 2020-07-12: qty 3

## 2020-07-12 MED ORDER — LACTATED RINGERS IV BOLUS (SEPSIS)
1000.0000 mL | Freq: Once | INTRAVENOUS | Status: AC
  Administered 2020-07-12: 1000 mL via INTRAVENOUS

## 2020-07-12 MED ORDER — SODIUM CHLORIDE 0.9 % IV SOLN
2.0000 g | Freq: Two times a day (BID) | INTRAVENOUS | Status: DC
  Administered 2020-07-12 – 2020-07-17 (×10): 2 g via INTRAVENOUS
  Filled 2020-07-12 (×11): qty 2

## 2020-07-12 MED ORDER — VANCOMYCIN HCL IN DEXTROSE 1-5 GM/200ML-% IV SOLN
1000.0000 mg | Freq: Once | INTRAVENOUS | Status: AC
  Administered 2020-07-12: 1000 mg via INTRAVENOUS
  Filled 2020-07-12: qty 200

## 2020-07-12 MED ORDER — SODIUM CHLORIDE 0.9 % IV SOLN
2.0000 g | Freq: Once | INTRAVENOUS | Status: AC
  Administered 2020-07-12: 2 g via INTRAVENOUS
  Filled 2020-07-12: qty 2

## 2020-07-12 MED ORDER — METRONIDAZOLE IN NACL 5-0.79 MG/ML-% IV SOLN
500.0000 mg | Freq: Three times a day (TID) | INTRAVENOUS | Status: DC
  Administered 2020-07-12 – 2020-07-17 (×15): 500 mg via INTRAVENOUS
  Filled 2020-07-12 (×15): qty 100

## 2020-07-12 MED ORDER — LACTATED RINGERS IV SOLN: INTRAVENOUS | Status: AC

## 2020-07-12 MED ORDER — LACTATED RINGERS IV BOLUS (SEPSIS)
500.0000 mL | Freq: Once | INTRAVENOUS | Status: AC
  Administered 2020-07-12: 500 mL via INTRAVENOUS

## 2020-07-12 MED ORDER — METRONIDAZOLE IN NACL 5-0.79 MG/ML-% IV SOLN
500.0000 mg | Freq: Once | INTRAVENOUS | Status: AC
  Administered 2020-07-12: 500 mg via INTRAVENOUS
  Filled 2020-07-12: qty 100

## 2020-07-12 NOTE — ED Notes (Signed)
Call sister when ready to transport  Jocelyn Simmons   [phone]

## 2020-07-12 NOTE — ED Triage Notes (Addendum)
Pt's sister( who is her guardian) states that this morning when she went to change pt's brief she noticed swelling to the  left side of her neck. States pt has been coughing since she got up this morning as well.  Pt has a history of Mental Retardation. Sister states pt has not been speaking as much and has went to several MDs last week about this. Has a fever of 101.0 on arrival. Pt has a large amount of swelling to the left side of neck. 02 sats 98% on RA. No distress noted. Pt does have a cough.

## 2020-07-12 NOTE — Progress Notes (Signed)
Pharmacy Antibiotic Note  Jocelyn Simmons is a 59 y.o. female admitted on 07/12/2020 with sepsis.  Pharmacy has been consulted for Vancomycin/Cefepime dosing. WBC is elevated. Renal function ok. Tmax 101. ?left sided neck swelling as source.   Plan: Vancomycin 750 mg IV q24h Cefepime 2g IV q12h Trend WBC, temp, renal function  F/U infectious work-up Drug levels as indicated   Weight: 48.5 kg (107 lb)  Temp (24hrs), Avg:101 F (38.3 C), Min:101 F (38.3 C), Max:101 F (38.3 C)  Recent Labs  Lab 07/12/20 0443  WBC 20.0*  CREATININE 0.71  LATICACIDVEN 4.7*    Estimated Creatinine Clearance: 58 mL/min (by C-G formula based on SCr of 0.71 mg/dL).    No Known Allergies  Abran Duke, PharmD, BCPS Clinical Pharmacist Phone: [phone]

## 2020-07-12 NOTE — ED Notes (Signed)
purewick cath placed. Awaiting CT scan.

## 2020-07-12 NOTE — ED Notes (Signed)
PT to CT.

## 2020-07-12 NOTE — ED Notes (Signed)
[facility] notified Riley Lam) - hospitalist consult

## 2020-07-12 NOTE — ED Provider Notes (Signed)
[place] EMERGENCY DEPARTMENT Provider Note   CSN: [id number] Arrival date & time: 07/12/20  0356     History Chief Complaint  Patient presents with  . swelling of neck    Jocelyn Simmons is a 59 y.o. female.  HPI     This a 59 year old female with a history of diabetes, hypertension, MR who presents with left neck swelling.  History is provided mostly by her sister and caregiver.  Reports over the last week or so, they have as issues with her diabetes and she has not been responding normally to her sister.  Her sister has a hard time elaborating but continues to state "we have taken her to a bunch of doctors and they cannot figure out what is wrong with her."  Sister reports that she has checked on her throughout the night and she was wet on her last check so she turned the lights on to change her bed.  She noted that the left side of her neck was significantly swollen.  This was new and not noted at bedtime.  Level 5 caveat for mental retardation.  Past Medical History:  Diagnosis Date  . Diabetes mellitus without complication ([place])   . Hypertension   . Mental retardation     Patient Active Problem List   Diagnosis Date Noted  . Sialoadenitis 07/12/2020  . Type 2 diabetes mellitus with ketoacidosis without coma, without long-term current use of insulin ([place])   . Essential hypertension   . DKA (diabetic ketoacidoses) ([place]) 08/19/2016  . Developmental delay disorder 08/19/2016    Past Surgical History:  Procedure Laterality Date  . ABDOMINAL HYSTERECTOMY    . BACK SURGERY    . CERVICAL SPINE SURGERY       OB History   No obstetric history on file.     No family history on file.  Social History   Tobacco Use  . Smoking status: Never Smoker  . Smokeless tobacco: Never Used  Substance Use Topics  . Alcohol use: No  . Drug use: No    Home Medications Prior to Admission medications   Medication Sig Start Date End Date Taking? Authorizing  Provider  amLODipine (NORVASC) 5 MG tablet Take 5 mg by mouth daily.   Yes [provider]  atorvastatin (LIPITOR) 20 MG tablet Take 20 mg by mouth daily.   Yes [provider]  busPIRone (BUSPAR) 15 MG tablet Take 15 mg by mouth 2 (two) times daily.   Yes [provider]  lisinopril (ZESTRIL) 5 MG tablet Take 5 mg by mouth daily.   Yes [provider]  pantoprazole (PROTONIX) 40 MG tablet Take 40 mg by mouth daily.   Yes [provider]  sitaGLIPtin (JANUVIA) 50 MG tablet Take 50 mg by mouth daily.   Yes [provider]  traZODone (DESYREL) 100 MG tablet Take 100 mg by mouth at bedtime.   Yes [provider]  ARIPiprazole (ABILIFY) 2 MG tablet Take 2 mg by mouth daily.    [provider]  blood glucose meter kit and supplies Dispense based on patient and insurance preference. Use up to four times daily as directed. (FOR ICD-9 250.00, 250.01). 08/21/16   Patrecia Pour, Christean Grief, MD  busPIRone (BUSPAR) 10 MG tablet Take 10 mg by mouth 2 (two) times daily.    [provider]  glipiZIDE (GLUCOTROL) 5 MG tablet Take 1 tablet (5 mg total) by mouth daily before breakfast. 08/21/16   Patrecia Pour, Christean Grief, MD  LORazepam (ATIVAN) 1 MG tablet Take 1 mg by mouth 2 (two) times daily as needed for anxiety.    [provider]  meloxicam (MOBIC) 7.5 MG tablet Take 7.5 mg by mouth 2 (two) times daily as needed for pain.  08/08/16   [provider]  metFORMIN (GLUCOPHAGE) 1000 MG tablet Take 1 tablet (1,000 mg total) by mouth 2 (two) times daily with a meal. 08/21/16   Patrecia Pour, Christean Grief, MD  PARoxetine (PAXIL) 40 MG tablet Take 40 mg by mouth every morning.    [provider]  traZODone (DESYREL) 50 MG tablet Take 50 mg by mouth at bedtime.     [provider]  triamterene-hydrochlorothiazide (DYAZIDE) 37.5-25 MG capsule Take 1 capsule by mouth daily. 08/03/16   [provider]    Allergies     Patient has no known allergies.  Review of Systems   Review of Systems  Unable to perform ROS: Patient nonverbal    Physical Exam Updated Vital Signs BP (!) 152/92   Pulse (!) 105   Temp (!) 101 F (38.3 C) (Oral)   Resp 19   Wt 48.5 kg   LMP  (LMP Unknown)   SpO2 96%   BMI 19.57 kg/m   Physical Exam Vitals and nursing note reviewed.  Constitutional:      Appearance: She is well-developed.     Comments: Chronically ill-appearing but nontoxic  HENT:     Head: Normocephalic and atraumatic.     Nose: Nose normal.     Mouth/Throat:     Mouth: Mucous membranes are dry.  Eyes:     Pupils: Pupils are equal, round, and reactive to light.  Neck:     Comments: Swelling noted in the left neck and submental region, firmness and tenderness to palpation, no pulsatile mass noted, no overlying skin changes Cardiovascular:     Rate and Rhythm: Regular rhythm. Tachycardia present.     Heart sounds: Normal heart sounds.  Pulmonary:     Effort: Pulmonary effort is normal. No respiratory distress.     Breath sounds: Rales present. No wheezing.  Abdominal:     General: Bowel sounds are normal.     Palpations: Abdomen is soft.     Tenderness: There is no abdominal tenderness.  Musculoskeletal:     Cervical back: Neck supple.  Skin:    General: Skin is warm and dry.  Neurological:     Mental Status: She is alert.     Comments: Unable to assess orientation, appears to move all 4 extremities  Psychiatric:        Mood and Affect: Mood normal.     ED Results / Procedures / Treatments   Labs (all labs ordered are listed, but only abnormal results are displayed) Labs Reviewed  CBC WITH DIFFERENTIAL/PLATELET - Abnormal; Notable for the following components:      Result Value   WBC 20.0 (*)    Neutro Abs 17.9 (*)    Abs Immature Granulocytes 0.11 (*)    All other components within normal limits  URINALYSIS, ROUTINE W REFLEX MICROSCOPIC - Abnormal; Notable for the following  components:   Glucose, UA 250 (*)    All other components within normal limits  COMPREHENSIVE METABOLIC PANEL - Abnormal; Notable for the following components:   Potassium 3.4 (*)    Glucose, Bld 241 (*)    All other components within normal limits  LACTIC ACID, PLASMA - Abnormal; Notable for the following components:   Lactic  Acid, Venous 4.7 (*)    All other components within normal limits  LACTIC ACID, PLASMA - Abnormal; Notable for the following components:   Lactic Acid, Venous 3.6 (*)    All other components within normal limits  SARS CORONAVIRUS 2 BY RT PCR (HOSPITAL ORDER, [facility])  CULTURE, BLOOD (ROUTINE X 2)  CULTURE, BLOOD (ROUTINE X 2)    EKG None  Radiology CT Soft Tissue Neck W Contrast  Result Date: 07/12/2020 CLINICAL DATA:  Fever and left neck swelling. EXAM: CT NECK WITH CONTRAST TECHNIQUE: Multidetector CT imaging of the neck was performed using the standard protocol following the bolus administration of intravenous contrast. CONTRAST:  62m OMNIPAQUE IOHEXOL 300 MG/ML  SOLN COMPARISON:  None. FINDINGS: Pharynx and larynx: Submucosal edema along the left pharynx and supraglottic larynx walls. No mucosal hyperenhancement. Salivary glands: Edema surrounds the enlarged left submandibular gland. Associated prominent proximal duct without detected stone. Contiguous patchy parapharyngeal gas on the left. No organized collection is seen. Thyroid: 7 mm right thyroid nodule. No followup recommended (ref: J Am Coll Radiol. 2015 Feb;12(2): 143-50). Lymph nodes: Mild adenitis on the left. Vascular: Negative. Limited intracranial: Negative. Visualized orbits: Postoperative globes on both sides. The left globe is small with thickened appearance of the sclera. Scleral band is present on the right. Mastoids and visualized paranasal sinuses: Negative Skeleton: Spondylosis.  Prior lower cervical laminectomy. Upper chest: Partially covered opacity in the  superior segment left lower lobe. IMPRESSION: 1. Left submandibular sialoadenitis with prominent swelling that involves the submucosal space of the left pharynx and supraglottic larynx. There is no organized collection but there is gas tracking in the left parapharyngeal space. 2. Partially covered left lower lobe airspace disease, question aspiration pneumonia in this setting. Recommend chest x-ray follow-up. Electronically Signed   By: JMonte FantasiaM.D.   On: 07/12/2020 06:34   DG Chest Port 1 View  Result Date: 07/12/2020 CLINICAL DATA:  Cough and fever EXAM: PORTABLE CHEST 1 VIEW COMPARISON:  12/03/2018 FINDINGS: Chronic elevation of the left diaphragm. Mild streaky density in the left lung. Clear right lung. No visible effusion or pneumothorax. Normal heart size. IMPRESSION: Patchy infiltrate on the left, question underlying aspiration given neck CT findings. Electronically Signed   By: JMonte FantasiaM.D.   On: 07/12/2020 06:37    Procedures Procedures (including critical care time)  CRITICAL CARE Performed by: CMerryl Hacker  Total critical care time: 60 minutes  Critical care time was exclusive of separately billable procedures and treating other patients.  Critical care was necessary to treat or prevent imminent or life-threatening deterioration.  Critical care was time spent personally by me on the following activities: development of treatment plan with patient and/or surrogate as well as nursing, discussions with consultants, evaluation of patient's response to treatment, examination of patient, obtaining history from patient or surrogate, ordering and performing treatments and interventions, ordering and review of laboratory studies, ordering and review of radiographic studies, pulse oximetry and re-evaluation of patient's condition.   Medications Ordered in ED Medications  lactated ringers infusion (has no administration in time range)  metroNIDAZOLE (FLAGYL) IVPB 500  mg (500 mg Intravenous New Bag/Given 07/12/20 0639)  vancomycin (VANCOCIN) IVPB 1000 mg/200 mL premix (has no administration in time range)  ceFEPIme (MAXIPIME) 2 g in sodium chloride 0.9 % 100 mL IVPB (has no administration in time range)  vancomycin (VANCOREADY) IVPB 750 mg/150 mL (has no administration in time range)  metroNIDAZOLE (FLAGYL) IVPB 500  mg (has no administration in time range)  sodium chloride 0.9 % bolus 1,000 mL (0 mLs Intravenous Stopped 07/12/20 0619)  iohexol (OMNIPAQUE) 300 MG/ML solution 75 mL (75 mLs Intravenous Contrast Given 07/12/20 0611)  lactated ringers bolus 1,000 mL (1,000 mLs Intravenous New Bag/Given 07/12/20 0631)    And  lactated ringers bolus 500 mL (500 mLs Intravenous New Bag/Given 07/12/20 0631)  ceFEPIme (MAXIPIME) 2 g in sodium chloride 0.9 % 100 mL IVPB (2 g Intravenous New Bag/Given 07/12/20 1601)    ED Course  I have reviewed the triage vital signs and the nursing notes.  Pertinent labs & imaging results that were available during my care of the patient were reviewed by me and considered in my medical decision making (see chart for details).  Clinical Course as of Jul 12 734  Mon Jul 12, 2020  0604 Lactate greater than 4.  Patient maintains good blood pressure.  However, given lactate greater than 4, white count of 20 and swelling in the neck, concern for acute sepsis.  She has received 1 L of fluids.  We will add broad-spectrum antibiotics.   [CH]  0605 Spoke to Dr. Janace Hoard, ENT.  Reviewed the CT findings.  No specific recommendations regarding antibiotic coverage but agrees she needs oral and anaerobic coverage.  At this time, no surgical intervention.  He would like to be consulted upon arrival to destination hospital.   [place] Spoke with Dr. Tammi Klippel, hospitalist. Agrees with transfer. Will need stepdown bed. Given tenuous airway, may need a more immediate transfer. He is requested that I put in ongoing antibiotics which I have ordered.   [CH]     Clinical Course User Index [CH] Rhyli Depaula, Barbette Hair, MD   MDM Rules/Calculators/A&P                          Patient presents with left-sided neck swelling.  She is febrile and tachycardic.  History of MR and does not provide much history although caregiver states over the last several days she has not been herself.  She has significant swelling to the left side of her neck and submandibular region.  She is currently protecting her airway but does not follow commands and I cannot visualize her posterior oropharynx.  Sepsis work-up was initiated.  She was initially given a liter of fluids as she is not hypotensive.  However, white count 20 and lactate greater than 4.  For this reason, she was given a full 30 cc/kg fluid and started on broad-spectrum antibiotics of Vanco, cefepime and Flagyl.  This should cover for oral flora as well.  Given her pulmonary exam,, some concern for aspiration as well.  CT scan shows no fluid collection but significant sialadenitis on the left with some gas.  Additionally, there is infiltrate in the left upper lobe concerning for aspiration.  Patient currently maintaining her airway although there is some shift on the CT scan that is concerning if swelling progresses further.  Discussed with ENT as above.  They will evaluate upon arrival to [facility].  Also discussed with the hospitalist.  See communications above.   Final Clinical Impression(s) / ED Diagnoses Final diagnoses:  Severe sepsis ([place])  Aspiration pneumonia of left upper lobe, unspecified aspiration pneumonia type [facility])    Rx / DC Orders ED Discharge Orders    None       Merryl Hacker, MD 07/12/20 [id number]

## 2020-07-12 NOTE — ED Notes (Signed)
Patient placed on 4L [place] humidified oxygen due to increased desaturation with rhonchi cough. Patient given a duoneb to help with secretions. Patient tolerated well.

## 2020-07-12 NOTE — ED Notes (Signed)
MD aware of lactic 4.7

## 2020-07-12 NOTE — Progress Notes (Addendum)
BP (!) 152/92   Pulse (!) 105   Temp (!) 101 F (38.3 C) (Oral)   Resp 19   Wt 48.5 kg   LMP  (LMP Unknown)   SpO2 96%   BMI 19.14 kg/m  59 year old with past medical history of diabetes mellitus uncontrolled, essential hypertension and MR presents to med [facility] clinic for neck swelling on the left side most of the history was provided by the caregiver, that relates as the last week they were having trouble normalizing her blood glucose due to clear to the doctor where they check her recurrent find reason for her erratic blood glucose.  This morning she was out of herself and with a significant swelling on her left side.  They did a CT of the neck that showed left submandibular sialadenitis with prominent swelling no abscess but there is gas collection tracking in the parapharyngeal space.  ED physician spoke to ENT Dr. Jearld Fenton will agree with management of IV bank cefepime and Flagyl and recommended transfer to [facility], he has friends to notify him once she gets here.  She was also found to have an aspiration pneumonia, she was fluid resuscitated her lactic acid is 4 will be repeated.  He is hemodynamically stable and more calm.  Have accepted her to stepdown unit.

## 2020-07-13 MED ORDER — VANCOMYCIN HCL 500 MG IV SOLR
INTRAVENOUS | Status: AC
  Filled 2020-07-13: qty 1000

## 2020-07-13 NOTE — ED Notes (Signed)
Spoke to pt sister Karin Golden and updated; she requested she be called when pt is awake; will relay to oncoming RN Karin Golden (sister): [phone]

## 2020-07-13 NOTE — ED Notes (Signed)
Spoke with pt sister, updated on bed request, also was updated on pt status

## 2020-07-13 NOTE — ED Notes (Signed)
Resting quietly, appears comfortable, family at bedside, pt is interacting per her baseline with staff and family. Safety measures in place, call bell within reach, family states she looks and appears much better.

## 2020-07-13 NOTE — ED Notes (Signed)
The sister left about 1 hour ago, would like updates when pt gets a bed.

## 2020-07-13 NOTE — ED Notes (Signed)
Pt. Family member was being loud and disrespectful, when I explained that we need to keep the door closed for Covid purposes.  She was verbally combative when explaining the circumstances to her.

## 2020-07-14 ENCOUNTER — Encounter (HOSPITAL_COMMUNITY): Payer: Self-pay | Admitting: Student

## 2020-07-14 DIAGNOSIS — E876 Hypokalemia: Secondary | ICD-10-CM

## 2020-07-14 DIAGNOSIS — J690 Pneumonitis due to inhalation of food and vomit: Secondary | ICD-10-CM

## 2020-07-14 DIAGNOSIS — E785 Hyperlipidemia, unspecified: Secondary | ICD-10-CM | POA: Diagnosis present

## 2020-07-14 DIAGNOSIS — Z8249 Family history of ischemic heart disease and other diseases of the circulatory system: Secondary | ICD-10-CM | POA: Diagnosis not present

## 2020-07-14 DIAGNOSIS — Z79899 Other long term (current) drug therapy: Secondary | ICD-10-CM | POA: Diagnosis not present

## 2020-07-14 DIAGNOSIS — E1169 Type 2 diabetes mellitus with other specified complication: Secondary | ICD-10-CM | POA: Diagnosis present

## 2020-07-14 DIAGNOSIS — K1120 Sialoadenitis, unspecified: Secondary | ICD-10-CM | POA: Diagnosis present

## 2020-07-14 DIAGNOSIS — F79 Unspecified intellectual disabilities: Secondary | ICD-10-CM | POA: Diagnosis present

## 2020-07-14 DIAGNOSIS — E119 Type 2 diabetes mellitus without complications: Secondary | ICD-10-CM

## 2020-07-14 DIAGNOSIS — A419 Sepsis, unspecified organism: Secondary | ICD-10-CM | POA: Diagnosis present

## 2020-07-14 DIAGNOSIS — F418 Other specified anxiety disorders: Secondary | ICD-10-CM

## 2020-07-14 DIAGNOSIS — Z794 Long term (current) use of insulin: Secondary | ICD-10-CM | POA: Diagnosis not present

## 2020-07-14 DIAGNOSIS — Z20822 Contact with and (suspected) exposure to covid-19: Secondary | ICD-10-CM | POA: Diagnosis present

## 2020-07-14 DIAGNOSIS — R6250 Unspecified lack of expected normal physiological development in childhood: Secondary | ICD-10-CM | POA: Diagnosis present

## 2020-07-14 DIAGNOSIS — I152 Hypertension secondary to endocrine disorders: Secondary | ICD-10-CM | POA: Diagnosis present

## 2020-07-14 DIAGNOSIS — R6520 Severe sepsis without septic shock: Secondary | ICD-10-CM | POA: Diagnosis present

## 2020-07-14 LAB — CBG MONITORING, ED
Glucose-Capillary: 131 mg/dL — ABNORMAL HIGH (ref 70–99)
Glucose-Capillary: 123 mg/dL — ABNORMAL HIGH (ref 70–99)
Glucose-Capillary: 125 mg/dL — ABNORMAL HIGH (ref 70–99)

## 2020-07-14 LAB — BASIC METABOLIC PANEL
Anion gap: 11 (ref 5–15)
BUN: 10 mg/dL (ref 6–20)
CO2: 27 mmol/L (ref 22–32)
Calcium: 8.6 mg/dL — ABNORMAL LOW (ref 8.9–10.3)
Chloride: 104 mmol/L (ref 98–111)
Creatinine, Ser: 0.61 mg/dL (ref 0.44–1.00)
GFR calc Af Amer: >60 mL/min (ref >60)
GFR calc non Af Amer: >60 mL/min (ref >60)
Glucose, Bld: 161 mg/dL — ABNORMAL HIGH (ref 70–99)
Potassium: 3.2 mmol/L — ABNORMAL LOW (ref 3.5–5.1)
Sodium: 142 mmol/L (ref 135–145)

## 2020-07-14 LAB — GLUCOSE, CAPILLARY
Glucose-Capillary: 132 mg/dL — ABNORMAL HIGH (ref 70–99)
Glucose-Capillary: 146 mg/dL — ABNORMAL HIGH (ref 70–99)

## 2020-07-14 LAB — LACTIC ACID, PLASMA: Lactic Acid, Venous: 1.1 mmol/L (ref 0.5–1.9)

## 2020-07-14 MED ORDER — ENOXAPARIN SODIUM 40 MG/0.4ML ~~LOC~~ SOLN
40.0000 mg | SUBCUTANEOUS | Status: DC
  Administered 2020-07-14 – 2020-07-16 (×3): 40 mg via SUBCUTANEOUS
  Filled 2020-07-14 (×3): qty 0.4

## 2020-07-14 MED ORDER — LACTATED RINGERS IV BOLUS: 250.0000 mL | Freq: Once | INTRAVENOUS | Status: DC

## 2020-07-14 MED ORDER — INSULIN ASPART 100 UNIT/ML ~~LOC~~ SOLN
0.0000 [IU] | SUBCUTANEOUS | Status: DC
  Administered 2020-07-15 (×3): 2 [IU] via SUBCUTANEOUS
  Administered 2020-07-15: 3 [IU] via SUBCUTANEOUS
  Administered 2020-07-16: 2 [IU] via SUBCUTANEOUS
  Administered 2020-07-16: 5 [IU] via SUBCUTANEOUS
  Administered 2020-07-16: 7 [IU] via SUBCUTANEOUS
  Administered 2020-07-16: 1 [IU] via SUBCUTANEOUS
  Administered 2020-07-16: 5 [IU] via SUBCUTANEOUS
  Administered 2020-07-16 – 2020-07-17 (×2): 3 [IU] via SUBCUTANEOUS
  Administered 2020-07-17: 5 [IU] via SUBCUTANEOUS
  Administered 2020-07-17: 7 [IU] via SUBCUTANEOUS
  Administered 2020-07-17: 2 [IU] via SUBCUTANEOUS

## 2020-07-14 MED ORDER — POTASSIUM CHLORIDE 10 MEQ/100ML IV SOLN
10.0000 meq | INTRAVENOUS | Status: AC
  Administered 2020-07-14 (×2): 10 meq via INTRAVENOUS
  Filled 2020-07-14 (×2): qty 100

## 2020-07-14 MED ORDER — SODIUM CHLORIDE 0.9 % IV BOLUS
1000.0000 mL | Freq: Once | INTRAVENOUS | Status: AC
  Administered 2020-07-14: 1000 mL via INTRAVENOUS

## 2020-07-14 MED ORDER — LABETALOL HCL 5 MG/ML IV SOLN
5.0000 mg | INTRAVENOUS | Status: DC | PRN
  Administered 2020-07-14: 5 mg via INTRAVENOUS
  Filled 2020-07-14: qty 4

## 2020-07-14 MED ORDER — SODIUM CHLORIDE 0.9% FLUSH
3.0000 mL | Freq: Two times a day (BID) | INTRAVENOUS | Status: DC
  Administered 2020-07-14 – 2020-07-17 (×5): 3 mL via INTRAVENOUS

## 2020-07-14 MED ORDER — VANCOMYCIN HCL 500 MG IV SOLR
INTRAVENOUS | Status: AC
  Administered 2020-07-14: 750 mg
  Filled 2020-07-14: qty 1000

## 2020-07-14 MED ORDER — ONDANSETRON HCL 4 MG/2ML IJ SOLN: 4.0000 mg | Freq: Four times a day (QID) | INTRAMUSCULAR | Status: DC | PRN

## 2020-07-14 MED ORDER — SODIUM CHLORIDE 0.9 % IV SOLN: Freq: Once | INTRAVENOUS | Status: AC

## 2020-07-14 MED ORDER — ONDANSETRON HCL 4 MG PO TABS: 4.0000 mg | ORAL_TABLET | Freq: Four times a day (QID) | ORAL | Status: DC | PRN

## 2020-07-14 MED ORDER — ACETAMINOPHEN 325 MG PO TABS
650.0000 mg | ORAL_TABLET | Freq: Four times a day (QID) | ORAL | Status: DC | PRN
  Administered 2020-07-16: 650 mg via ORAL
  Filled 2020-07-14: qty 2

## 2020-07-14 MED ORDER — ACETAMINOPHEN 650 MG RE SUPP: 650.0000 mg | Freq: Four times a day (QID) | RECTAL | Status: DC | PRN

## 2020-07-14 MED ORDER — POTASSIUM CHLORIDE 2 MEQ/ML IV SOLN
INTRAVENOUS | Status: DC
  Filled 2020-07-14 (×3): qty 1000

## 2020-07-14 NOTE — H&P (Signed)
History and Physical    Jocelyn Simmons FBP:[id number] DOB: [birth year] DOA: 07/12/2020  PCP: [facility]  Patient coming from: [place] [facility]  I have personally briefly reviewed patient's old medical records in [place]  Chief Complaint: Left neck swelling  HPI: Jocelyn Simmons is a 59 y.o. female with medical history significant for insulin-dependent type 2 diabetes, HTN, HLD, anxiety/depression, and intellectual disability who initially presented to [place] ED on 07/12/2020 for evaluation of left neck swelling.  History limited from patient due to intellectual disability therefore is otherwise supplemented by chart review.  Patient was brought to [place] ED due to family noticing swelling to the left side of her neck.  She has had associated cough and was not speaking or interacting as much as she usually does.  [facility] ED Course:  Initial vitals showed BP 126/96, pulse 112, RR 20, temp 101.0 Fahrenheit, SPO2 97% on room air.  Labs notable for WBC 20, hemoglobin 12.4, platelets 340,000, sodium 138, potassium 3.4, bicarb 23, BUN 11, creatinine 0.71, LFTs within normal limits.  Lactic acid 4.7 >> 3.6.  Urinalysis negative for UTI.  SARS-CoV-2 PCR negative.  Blood cultures collected 07/12/2020 no growth to date.  CT soft tissue neck with contrast showed left submandibular sialoadenitis with prominent swelling involving the submucosal space of the left pharynx and supraglottic larynx without organized collection.  Gas tracking in the left parapharyngeal space is noted.  Partially covered left lower lobe airspace disease also seen.  Portable chest x-ray showed patchy infiltrate on the left.  Patient was given 1 L normal saline, 1.5 L LR and started on empiric IV vancomycin, cefepime, and Flagyl.  EDP discussed with on-call ENT who recommended continued IV antibiotics to notify of arrival after transfer.  The hospitalist service  was consulted to admit for further management.  While holding in the ED, patient was noted to desaturate and was placed on supplemental O2 with improvement.  Patient was also given IV K 10 mEq x 2 for potassium 3.2 morning of 07/14/2020.  Lactic acid improved to 1.1.  Patient also started on maintenance IV fluids.  Review of Systems:  Unable to obtain full review of systems due to intellectual disability.   Past Medical History:  Diagnosis Date  . Diabetes mellitus without complication ([place])   . Hypertension   . Mental retardation     Past Surgical History:  Procedure Laterality Date  . ABDOMINAL HYSTERECTOMY    . BACK SURGERY    . CERVICAL SPINE SURGERY      Social History:  reports that she has never smoked. She has never used smokeless tobacco. She reports that she does not drink alcohol and does not use drugs.  No Known Allergies  Family History  Problem Relation Age of Onset  . Hypertension Father      Prior to Admission medications   Medication Sig Start Date End Date Taking? Authorizing Provider  amLODipine (NORVASC) 5 MG tablet Take 10 mg by mouth daily.    Yes [provider]  ARIPiprazole (ABILIFY) 5 MG tablet Take 5 mg by mouth See admin instructions. Take 2.6m daily, May increase to 536mdaily as needed for mood control.   Yes [provider]  atorvastatin (LIPITOR) 20 MG tablet Take 20 mg by mouth at bedtime.    Yes [provider]  busPIRone (BUSPAR) 15 MG tablet Take 15 mg by mouth 2 (two) times daily.  Yes [provider]  HUMALOG KWIKPEN 100 UNIT/ML KwikPen Inject 0-10 Units into the skin See admin instructions. Sliding Scale If BS is 0-100= 0 units, 100-200=4 units 201-300=6units 301-350=8 units  >350=10 units 06/14/20  Yes [provider]  lisinopril (ZESTRIL) 5 MG tablet Take 5 mg by mouth daily.   Yes [provider]  LORazepam (ATIVAN) 1 MG tablet Take 0.5-1 mg by mouth 2 (two) times daily as needed  for anxiety (extreme agitation).    Yes [provider]  metFORMIN (GLUCOPHAGE) 1000 MG tablet Take 1 tablet (1,000 mg total) by mouth 2 (two) times daily with a meal. 08/21/16  Yes Patrecia Pour, Christean Grief, MD  NUEDEXTA 20-10 MG capsule Take 1 capsule by mouth 2 (two) times daily. 06/25/20  Yes [provider]  pantoprazole (PROTONIX) 40 MG tablet Take 40 mg by mouth daily.   Yes [provider]  PARoxetine (PAXIL) 40 MG tablet Take 40 mg by mouth at bedtime.    Yes [provider]  sitaGLIPtin (JANUVIA) 50 MG tablet Take 50 mg by mouth daily.   Yes [provider]  traZODone (DESYREL) 100 MG tablet Take 50-100 mg by mouth at bedtime. For Unsteady gait/dizziness   Yes [provider]  blood glucose meter kit and supplies Dispense based on patient and insurance preference. Use up to four times daily as directed. (FOR ICD-9 250.00, 250.01). 08/21/16   Patrecia Pour, Christean Grief, MD    Physical Exam: Vitals:   07/14/20 1726 07/14/20 1800 07/14/20 1858 07/14/20 1955  BP: (!) 147/121 (!) 153/122 (!) 172/96 (!) 164/122  Pulse: (!) 105 100 (!) 102 90  Resp: (!) 30 (!) 28 (!) 33 18  Temp:    98.7 F (37.1 C)  TempSrc:    Oral  SpO2: 95% 96% 97% 98%  Weight:       Constitutional: Thin woman resting in bed with head elevated, NAD, calm, comfortable Eyes: Left pupil opacified, right pupil reactive to light, EOMI ENMT: Swelling at the left submandibular and left upper lateral neck, appears mildly tender to palpation.  Mucous membranes are dry. Posterior pharynx clear of any exudate or lesions. Poor dentition.  Neck: normal, supple, no masses. Respiratory: Frequent coughing, clear to auscultation bilaterally, no wheezing, no crackles. Normal respiratory effort. No accessory muscle use.  Cardiovascular: Regular rate and rhythm, no murmurs / rubs / gallops. No extremity edema. 2+ pedal pulses. Abdomen: no tenderness, no masses palpated. No hepatosplenomegaly.  Bowel sounds positive.  Musculoskeletal: no clubbing / cyanosis. No joint deformity upper and lower extremities. Good ROM. Normal muscle tone.  Skin: no rashes, lesions, ulcers. No induration Neurologic: Sensation intact, Strength 5/5 in all 4.  Psychiatric: Awake and alert, following simple commands.  Otherwise limited due to intellectual disability.  Labs on Admission: I have personally reviewed following labs and imaging studies  CBC: Recent Labs  Lab 07/12/20 0443  WBC 20.0*  NEUTROABS 17.9*  HGB 12.4  HCT 38.2  MCV 87.4  PLT 466   Basic Metabolic Panel: Recent Labs  Lab 07/12/20 0443 07/14/20 0550  NA 138 142  K 3.4* 3.2*  CL 100 104  CO2 23 27  GLUCOSE 241* 161*  BUN 11 10  CREATININE 0.71 0.61  CALCIUM 9.2 8.6*   GFR: Estimated Creatinine Clearance: 58 mL/min (by C-G formula based on SCr of 0.61 mg/dL). Liver Function Tests: Recent Labs  Lab 07/12/20 0443  AST 24  ALT 25  ALKPHOS 65  BILITOT 0.8  PROT 7.6  ALBUMIN 4.4   No results for input(s): LIPASE, AMYLASE in the last 168 hours. No results for input(s): AMMONIA in the last 168 hours. Coagulation Profile: No results for input(s): INR, PROTIME in the last 168 hours. Cardiac Enzymes: No results for input(s): CKTOTAL, CKMB, CKMBINDEX, TROPONINI in the last 168 hours. BNP (last 3 results) No results for input(s): PROBNP in the last 8760 hours. HbA1C: No results for input(s): HGBA1C in the last 72 hours. CBG: Recent Labs  Lab 07/14/20 0929 07/14/20 1305 07/14/20 1838  GLUCAP 131* 123* 125*   Lipid Profile: No results for input(s): CHOL, HDL, LDLCALC, TRIG, CHOLHDL, LDLDIRECT in the last 72 hours. Thyroid Function Tests: No results for input(s): TSH, T4TOTAL, FREET4, T3FREE, THYROIDAB in the last 72 hours. Anemia Panel: No results for input(s): VITAMINB12, FOLATE, FERRITIN, TIBC, IRON, RETICCTPCT in the last 72 hours. Urine analysis:    Component Value Date/Time   COLORURINE YELLOW  07/12/2020 0720   APPEARANCEUR CLEAR 07/12/2020 0720   LABSPEC 1.010 07/12/2020 0720   PHURINE 5.5 07/12/2020 0720   GLUCOSEU 250 (A) 07/12/2020 0720   HGBUR NEGATIVE 07/12/2020 0720   BILIRUBINUR NEGATIVE 07/12/2020 0720   KETONESUR NEGATIVE 07/12/2020 0720   PROTEINUR NEGATIVE 07/12/2020 0720   UROBILINOGEN 1.0 07/13/2014 1240   NITRITE NEGATIVE 07/12/2020 0720   LEUKOCYTESUR NEGATIVE 07/12/2020 0720    Radiological Exams on Admission: No results found.  07/12/2020: CT soft tissue neck with contrast: 1. Left submandibular sialoadenitis with prominent swelling that involves the submucosal space of the left pharynx and supraglottic larynx. There is no organized collection but there is gas tracking in the left parapharyngeal space. 2. Partially covered left lower lobe airspace disease, question aspiration pneumonia in this setting. Recommend chest x-ray follow-up.  Portable chest x-ray: Patchy infiltrate on the left, question underlying aspiration given neck CT findings.  EKG: Independently reviewed. Sinus tachycardia, rate 102, early R wave transition, QTC 490.  Rate faster when compared to prior.  Assessment/Plan Principal Problem:   Severe sepsis (HCC) Active Problems:   Hypertension associated with diabetes ([place])   Sialoadenitis   Insulin dependent type 2 diabetes mellitus (HCC)   Hypokalemia   Hyperlipidemia associated with type 2 diabetes mellitus ([place])   Depression with anxiety   Aspiration pneumonia of left upper lobe (HCC)  Jocelyn Simmons is a 59 y.o. female with medical history significant for insulin-dependent type 2 diabetes, HTN, HLD, anxiety/depression, and intellectual disability who is admitted with severe sepsis due to left submandibular sialadenitis.  Severe sepsis (present on arrival) due to left submandibular sialadenitis: Initially presented to ED with fever, temp 101F, tachycardia pulse 112, leukocytosis WBC 20, RR up to 33, and lactic acidosis 4.7.   Patient was started on broad-spectrum IV antibiotics. -Continue IV vancomycin, cefepime, Flagyl -ENT aware and to see in a.m. -Keep n.p.o. -Follow blood cultures 07/12/20 no growth to date -Continue IV fluid hydration overnight -Aspiration precautions, SLP eval  Aspiration pneumonia of left upper lobe: Continue antibiotics and aspiration precautions as above.  Supplemental oxygen as needed.  Insulin-dependent type 2 diabetes: Home Humalog, Metformin, Januvia on hold while NPO.  Place on sensitive SSI q4h.  Hypokalemia: Add potassium supplement to fluids.  Check magnesium, repeat BMP in a.m.  Hypertension: Hypertensive on arrival after transfer.  Home lisinopril and amlodipine on hold while NPO.  IV labetalol as needed.  Hyperlipidemia: Atorvastatin on hold while NPO.  Depression/anxiety/intellectual disability: Home meds on hold while NPO.  Resume as soon as able.  DVT prophylaxis: Lovenox Code  Status: Full code Family Communication: Attempted to call sister at listed number in demographics without answer Disposition Plan: From home and likely discharge to home pending clinical progress Consults called: ENT, Dr. Janace Hoard to see in a.m. Admission status:  Status is: Inpatient  Remains inpatient appropriate because:IV treatments appropriate due to intensity of illness or inability to take PO and Inpatient level of care appropriate due to severity of illness   Dispo: The patient is from: Home              Anticipated d/c is to: Home              Anticipated d/c date is: 3 days              Patient currently is not medically stable to d/c.   Zada Finders MD [facility]  If 7PM-7AM, please contact night-coverage [email or web address]  07/14/2020, 9:19 PM

## 2020-07-14 NOTE — ED Notes (Signed)
Pt's guardian (sister) updated and aware we are waiting for an inpt bed.

## 2020-07-14 NOTE — ED Notes (Signed)
K level is 3.2. Dr. Juleen China reviewed the chart and orders received. Also asked Dr. Juleen China to address blood sugar checks on this patient. She has a history of DM. He is aware that blood sugar on bmp this morning was 161.

## 2020-07-14 NOTE — ED Notes (Signed)
BP elevated, admitting MD notified

## 2020-07-14 NOTE — ED Provider Notes (Addendum)
Patient has been holding for admission for diagnoses of left submandibular sialoadenitis, severe sepsis and suspected aspiration pneumonia of left upper lobe.  She has been receiving scheduled antibiotics.  She has not been receiving IV fluids.  Blood cultures drawn on 07/14/2020 have been negative.  Maintenance IV fluids ordered as patient has been NPO.   Patient states she is feeling good and her pain is adequately controlled presently.  She has left-sided facial swelling consistent with diagnosis of sialoadenitis.     Paula Libra, MD 07/14/20 0602    Paula Libra, MD 07/14/20 0630

## 2020-07-14 NOTE — ED Notes (Signed)
02 decreased to 2l via n/c. Sats 97%. Pt's was incont of urine. A new purewick was placed. Peri care provided and a new brief placed.

## 2020-07-15 ENCOUNTER — Encounter (HOSPITAL_COMMUNITY): Payer: Self-pay | Admitting: Student

## 2020-07-15 LAB — MAGNESIUM: Magnesium: 1.5 mg/dL — ABNORMAL LOW (ref 1.7–2.4)

## 2020-07-15 LAB — HEMOGLOBIN A1C
Hgb A1c MFr Bld: 6.4 % — ABNORMAL HIGH (ref 4.8–5.6)
Mean Plasma Glucose: 136.98 mg/dL

## 2020-07-15 LAB — BASIC METABOLIC PANEL
Anion gap: 14 (ref 5–15)
BUN: 5 mg/dL — ABNORMAL LOW (ref 6–20)
CO2: 19 mmol/L — ABNORMAL LOW (ref 22–32)
Calcium: 8.6 mg/dL — ABNORMAL LOW (ref 8.9–10.3)
Chloride: 104 mmol/L (ref 98–111)
Creatinine, Ser: 0.65 mg/dL (ref 0.44–1.00)
GFR calc Af Amer: >60 mL/min (ref >60)
GFR calc non Af Amer: >60 mL/min (ref >60)
Glucose, Bld: 157 mg/dL — ABNORMAL HIGH (ref 70–99)
Potassium: 3.3 mmol/L — ABNORMAL LOW (ref 3.5–5.1)
Sodium: 137 mmol/L (ref 135–145)

## 2020-07-15 LAB — HIV ANTIBODY (ROUTINE TESTING W REFLEX): HIV Screen 4th Generation wRfx: NONREACTIVE

## 2020-07-15 LAB — GLUCOSE, CAPILLARY
Glucose-Capillary: 151 mg/dL — ABNORMAL HIGH (ref 70–99)
Glucose-Capillary: 154 mg/dL — ABNORMAL HIGH (ref 70–99)
Glucose-Capillary: 183 mg/dL — ABNORMAL HIGH (ref 70–99)
Glucose-Capillary: 188 mg/dL — ABNORMAL HIGH (ref 70–99)
Glucose-Capillary: 192 mg/dL — ABNORMAL HIGH (ref 70–99)
Glucose-Capillary: 201 mg/dL — ABNORMAL HIGH (ref 70–99)

## 2020-07-15 LAB — CBC
HCT: 32.4 % — ABNORMAL LOW (ref 36.0–46.0)
Hemoglobin: 10.4 g/dL — ABNORMAL LOW (ref 12.0–15.0)
MCH: 28.3 pg (ref 26.0–34.0)
MCHC: 32.1 g/dL (ref 30.0–36.0)
MCV: 88 fL (ref 80.0–100.0)
Platelets: 276 10*3/uL (ref 150–400)
RBC: 3.68 MIL/uL — ABNORMAL LOW (ref 3.87–5.11)
RDW: 12.4 % (ref 11.5–15.5)
WBC: 9.9 10*3/uL (ref 4.0–10.5)
nRBC: 0 % (ref 0.0–0.2)

## 2020-07-15 MED ORDER — MAGNESIUM SULFATE 2 GM/50ML IV SOLN
2.0000 g | Freq: Once | INTRAVENOUS | Status: AC
  Administered 2020-07-15: 2 g via INTRAVENOUS
  Filled 2020-07-15: qty 50

## 2020-07-15 MED ORDER — POTASSIUM CHLORIDE CRYS ER 20 MEQ PO TBCR
20.0000 meq | EXTENDED_RELEASE_TABLET | Freq: Two times a day (BID) | ORAL | Status: AC
  Administered 2020-07-15 – 2020-07-16 (×4): 20 meq via ORAL
  Filled 2020-07-15 (×4): qty 1

## 2020-07-15 NOTE — Consult Note (Signed)
Reason for Consult:neck swelling Referring Physician: hospitalist  Promyse Ardito is an 59 y.o. female.  HPI: 59 yo with medical history significant for insulin-dependent type 2 diabetes, HTN, HLD, anxiety/depression, and intellectual disability who initially presented to [facility] ED on 07/12/2020 for evaluation of left neck swelling. Ct scan had shown sialadenitis withextension of inflammation in pharynx. No tooth issue.  She has now been on IV abx in [place] for 2 days. She cannot give a status of her condition Past Medical History:  Diagnosis Date  . Diabetes mellitus without complication (HCC)   . Hypertension   . Mental retardation     Past Surgical History:  Procedure Laterality Date  . ABDOMINAL HYSTERECTOMY    . BACK SURGERY    . CERVICAL SPINE SURGERY      Family History  Problem Relation Age of Onset  . Hypertension Father     Social History:  reports that she has never smoked. She has never used smokeless tobacco. She reports that she does not drink alcohol and does not use drugs.  Allergies: No Known Allergies  Medications: I have reviewed the patient's current medications.  Results for orders placed or performed during the hospital encounter of 07/12/20 (from the past 48 hour(s))  Basic metabolic panel     Status: Abnormal   Collection Time: 07/14/20  5:50 AM  Result Value Ref Range   Sodium 142 135 - 145 mmol/L   Potassium 3.2 (L) 3.5 - 5.1 mmol/L   Chloride 104 98 - 111 mmol/L   CO2 27 22 - 32 mmol/L   Glucose, Bld 161 (H) 70 - 99 mg/dL    Comment: Glucose reference range applies only to samples taken after fasting for at least 8 hours.   BUN 10 6 - 20 mg/dL   Creatinine, Ser 1.61 0.44 - 1.00 mg/dL   Calcium 8.6 (L) 8.9 - 10.3 mg/dL   GFR calc non Af Amer >60 >60 mL/min   GFR calc Af Amer >60 >60 mL/min   Anion gap 11 5 - 15    Comment: Performed at [facility], [address] [place], [place] [address]  CBG monitoring, ED      Status: Abnormal   Collection Time: 07/14/20  9:29 AM  Result Value Ref Range   Glucose-Capillary 131 (H) 70 - 99 mg/dL    Comment: Glucose reference range applies only to samples taken after fasting for at least 8 hours.  POC CBG, ED     Status: Abnormal   Collection Time: 07/14/20  1:05 PM  Result Value Ref Range   Glucose-Capillary 123 (H) 70 - 99 mg/dL    Comment: Glucose reference range applies only to samples taken after fasting for at least 8 hours.  Lactic acid, plasma     Status: None   Collection Time: 07/14/20  1:23 PM  Result Value Ref Range   Lactic Acid, Venous 1.1 0.5 - 1.9 mmol/L    Comment: Performed at [facility], [address] [place], [place] [address]  POC CBG, ED     Status: Abnormal   Collection Time: 07/14/20  6:38 PM  Result Value Ref Range   Glucose-Capillary 125 (H) 70 - 99 mg/dL    Comment: Glucose reference range applies only to samples taken after fasting for at least 8 hours.  Glucose, capillary     Status: Abnormal   Collection Time: 07/14/20  9:24 PM  Result Value Ref Range  Glucose-Capillary 132 (H) 70 - 99 mg/dL    Comment: Glucose reference range applies only to samples taken after fasting for at least 8 hours.  Glucose, capillary     Status: Abnormal   Collection Time: 07/14/20 11:24 PM  Result Value Ref Range   Glucose-Capillary 146 (H) 70 - 99 mg/dL    Comment: Glucose reference range applies only to samples taken after fasting for at least 8 hours.  Hemoglobin A1c     Status: Abnormal   Collection Time: 07/15/20  2:14 AM  Result Value Ref Range   Hgb A1c MFr Bld 6.4 (H) 4.8 - 5.6 %    Comment: (NOTE) Pre diabetes:          5.7%-6.4%  Diabetes:              >6.4%  Glycemic control for   <7.0% adults with diabetes    Mean Plasma Glucose 136.98 mg/dL    Comment: Performed at [facility], [address] [place], [place] [address]  Basic metabolic panel     Status: Abnormal   Collection Time: 07/15/20  2:15 AM   Result Value Ref Range   Sodium 137 135 - 145 mmol/L   Potassium 3.3 (L) 3.5 - 5.1 mmol/L   Chloride 104 98 - 111 mmol/L   CO2 19 (L) 22 - 32 mmol/L   Glucose, Bld 157 (H) 70 - 99 mg/dL    Comment: Glucose reference range applies only to samples taken after fasting for at least 8 hours.   BUN 5 (L) 6 - 20 mg/dL   Creatinine, Ser 9.75 0.44 - 1.00 mg/dL   Calcium 8.6 (L) 8.9 - 10.3 mg/dL   GFR calc non Af Amer >60 >60 mL/min   GFR calc Af Amer >60 >60 mL/min   Anion gap 14 5 - 15    Comment: Performed at [facility], [address] [place], [place] [address]  CBC     Status: Abnormal   Collection Time: 07/15/20  2:15 AM  Result Value Ref Range   WBC 9.9 4.0 - 10.5 K/uL   RBC 3.68 (L) 3.87 - 5.11 MIL/uL   Hemoglobin 10.4 (L) 12.0 - 15.0 g/dL   HCT 49.8 (L) 36 - 46 %   MCV 88.0 80.0 - 100.0 fL   MCH 28.3 26.0 - 34.0 pg   MCHC 32.1 30.0 - 36.0 g/dL   RDW 26.4 15.8 - 30.9 %   Platelets 276 150 - 400 K/uL   nRBC 0.0 0.0 - 0.2 %    Comment: Performed at [facility], [address], [place], [place] [address]  Magnesium     Status: Abnormal   Collection Time: 07/15/20  2:15 AM  Result Value Ref Range   Magnesium 1.5 (L) 1.7 - 2.4 mg/dL    Comment: Performed at [facility], [address], [place], [place] [address]  Glucose, capillary     Status: Abnormal   Collection Time: 07/15/20  3:16 AM  Result Value Ref Range   Glucose-Capillary 154 (H) 70 - 99 mg/dL    Comment: Glucose reference range applies only to samples taken after fasting for at least 8 hours.    No results found.  Review of Systems Blood pressure (!) 163/95, pulse 72, temperature 99.5 F (37.5 C), temperature source Oral, resp. rate 16, height 5\' 4"  (1.626 m), weight 49.7 kg, SpO2 100 %. Physical Exam HENT:     Head:  Comments: She is awake and alert but she has obvious mental challenges and cannot convey a her condition.  The floor mouth looks clear.  The pharynx looks normal with no  significant swelling.  She opens her mouth well with no trismus.  There does not appear to be any dental problems.  The neck has a left submandibular swelling that is confined to just the submandibular gland which is enlarged.  There is no surrounding edema or induration.  The skin does not have any erythema.  There is no extension outside the submandibular space by palpation    Nose: Nose normal.     Assessment/Plan: Sialadenitis- she has enlargement of the left submandibular gland.  It did have some extension of swelling into the left pharynx.  There was some gas in the parapharyngeal region.  No stone was detected.  She now has been on IV antibiotics for 2 days in [facility] and seems to be doing well.  Her oral cavity looks normal.  The submandibular gland is enlarged on the left side but there does not seem to be any significant induration or tenderness.  There is no cellulitis of the skin.  It seems like she is responded well and since she is essentially nonverbal it might be reasonable to repeat the CT scan just to see that the gas in her pharynx and surrounding swelling have dramatically improved which clinically it seems like that is the case.  If there is a dramatic improvement and no extension then she can probably be discharged on clindamycin for 10 days and follow-up.  Suzanna Obey 07/15/2020, 9:34 AM

## 2020-07-15 NOTE — Evaluation (Addendum)
Clinical/Bedside Swallow Evaluation Patient Details  Name: Jocelyn Simmons MRN: [id number] Date of Birth: 09/08/1961  Today's Date: 07/15/2020 Time: SLP Start Time (ACUTE ONLY): 1152 SLP Stop Time (ACUTE ONLY): 1210 SLP Time Calculation (min) (ACUTE ONLY): 18 min  Past Medical History:  Past Medical History:  Diagnosis Date  . Diabetes mellitus without complication (HCC)   . Hypertension   . Mental retardation    Past Surgical History:  Past Surgical History:  Procedure Laterality Date  . ABDOMINAL HYSTERECTOMY    . BACK SURGERY    . CERVICAL SPINE SURGERY     HPI:   59 yo with medical history significant for insulin-dependent type 2 diabetes, HTN, HLD, anxiety/depression, and intellectual disability who presented for evaluation of left neck swelling. CT revealed gas in the parapharyngeal region,  no stone detexted. ENT notes states submandibular glad is enlarged on left side, no cellulitis of the skin. Pt was on antibiotics for 2 days. ENT recommended to possibly repeat CT scan for improved edema. CXR Patchy infiltrate on the left, question underlying aspiration given neck CT findings.   Assessment / Plan / Recommendation Clinical Impression  Pt's sister present who is her caregiver and guardian reported exhibiting difficulty masticating prior to admission "she doesn't chew and her nose runs so that makes me think she is getting choked." Pt consumed multiple sips thin via straw, pudding and trial of solid. S/S aspiration were not present and her timing and transit of solid was not significantly delayed. Pt did not display indications of pain or any discomfort during mastication in regards to left neck edema. Discussed cognitive effects on mastication and sister stated she plans to puree pt's food at home and therapist in agreement. Pt has history of dementia and educated sister re: potential for increased oral impairments. Diet downgraded to Dys 1 (puree), continue thin, control volume,  crush meds and full supervision.  No further ST warranted.      SLP Visit Diagnosis: Dysphagia, unspecified (R13.10)    Aspiration Risk  Mild aspiration risk;Moderate aspiration risk    Diet Recommendation Thin liquid;Dysphagia 1 (Puree)   Liquid Administration via: Cup;Straw Medication Administration: Crushed with puree Supervision: Staff to assist with self feeding;Full supervision/cueing for compensatory strategies Compensations: Minimize environmental distractions;Slow rate;Small sips/bites Postural Changes: Seated upright at 90 degrees    Other  Recommendations Oral Care Recommendations: Oral care BID   Follow up Recommendations None      Frequency and Duration          Prognosis Prognosis for Safe Diet Advancement: Good Barriers to Reach Goals: Cognitive deficits      Swallow Study   59 yo with medical history significant for insulin-dependent type 2 diabetes, HTN, HLD, anxiety/depression, and intellectual disability who presented for evaluation of left neck swelling. CT revealed gas in the parapharyngeal region,  no stone detexted. ENT notes states submandibular glad is enlarged on left side, no cellulitis of the skin. Pt was on antibiotics for 2 days. ENT recommended to possibly repeat CT scan for improved edema. CXR Patchy infiltrate on the left, question underlying aspiration given neck CT findings. Type of Study: Bedside Swallow Evaluation Previous Swallow Assessment:  (none) Diet Prior to this Study: NPO Temperature Spikes Noted: No Respiratory Status: Nasal cannula History of Recent Intubation: No Behavior/Cognition: Cooperative;Alert;Confused;Requires cueing;Distractible Oral Cavity Assessment: Dry Oral Care Completed by SLP: Yes Oral Cavity - Dentition: Missing dentition Vision: Functional for self-feeding Self-Feeding Abilities: Needs assist Patient Positioning: Upright in bed Baseline Vocal  Quality: Low vocal intensity (limited) Volitional  Cough: Cognitively unable to elicit Volitional Swallow: Unable to elicit    Oral/Motor/Sensory Function Overall Oral Motor/Sensory Function: Within functional limits   Ice Chips Ice chips: Not tested   Thin Liquid Thin Liquid: Within functional limits Presentation: Cup;Straw    Nectar Thick Nectar Thick Liquid: Not tested   Honey Thick Honey Thick Liquid: Not tested   Puree Puree: Within functional limits   Solid     Solid: Impaired Oral Phase Impairments: Reduced lingual movement/coordination Oral Phase Functional Implications: Prolonged oral transit      Royce Macadamia 07/15/2020,2:59 PM  Breck Coons [place].Ed Nurse, children's [phone] Office [phone]

## 2020-07-15 NOTE — Progress Notes (Signed)
PROGRESS NOTE    Jocelyn Simmons  TKZ:[id number] DOB: 01-29-1961 DOA: 07/12/2020 PCP: [facility]    Brief Narrative:  59 year old female with history of insulin-dependent diabetes, hypertension, hyperlipidemia, anxiety depression and intellectual disability lives at home with her sister brought to the med [facility] on 8/16 for left neck swelling, pain, unable to talk.  Initial blood pressure was 126/96, tachycardic with heart rate 112.  Temperature 101.  WBC count 20,000.  Lactic acid 4.7--3.6.  CT scan of the soft tissue of the neck with contrast showed left submandibular sialoadenitis with prominent swelling involving the submucosal space of the left pharynx and supraglottic larynx without organized collection.  She was treated aggressively with IV fluids and antibiotics.  She stayed at emergency room for 2 days before transferring to the hospital.  Chest x-ray also showed left-sided lower lobe pneumonia.  Assessment & Plan:   Principal Problem:   Severe sepsis (HCC) Active Problems:   Hypertension associated with diabetes (HCC)   Sialoadenitis   Insulin dependent type 2 diabetes mellitus (HCC)   Hypokalemia   Hyperlipidemia associated with type 2 diabetes mellitus (HCC)   Depression with anxiety   Aspiration pneumonia of left upper lobe (HCC)  Severe sepsis present on admission due to left submandibular sialoadenitis and abscess: Sepsis physiology improving.  WBC count and lactic acid normalized.  Temperature normalized. Patient has been receiving IV vancomycin, cefepime and Flagyl for last 48 hours with clinical improvement.  Cultures negative so far.  Discontinue vancomycin, continue cefepime and Flagyl today. Discontinue IV fluids as he started eating. Seen by ENT, they are planning to repeat CT scan before deciding surgical versus conservative management. Patient's sister who is healthcare power of attorney favors conservative management as much  as possible.  Left upper lobe pneumonia: Suspect aspiration.  Improving.  Speech swallow evaluation.  Antibiotics as above.  Cultures negative so far.  Type 2 diabetes, well controlled: Patient is on sliding scale insulin and Metformin and Januvia at home.  Holding all her oral hypoglycemics.  Keep on sliding scale insulin.  Essential hypertension: Blood pressure is low on arrival.  Continue to hold antihypertensives.  Intellectual disability/behavior problems/anxiety and depression: Patient is on multiple SSRI, SNRI and mood stabilizers that she will resume today.  Hypokalemia/hypomagnesemia: We will replace aggressively and monitor levels.   DVT prophylaxis: enoxaparin (LOVENOX) injection 40 mg Start: 07/14/20 2130   Code Status: Full code Family Communication: Patient's sister on the phone Disposition Plan: Status is: Inpatient  Remains inpatient appropriate because:IV treatments appropriate due to intensity of illness or inability to take PO   Dispo: The patient is from: Home              Anticipated d/c is to: Home              Anticipated d/c date is: 2 days              Patient currently is not medically stable to d/c.         Consultants:   ENT  Procedures:   None  Antimicrobials:  Antibiotics Given (last 72 hours)    Date/Time Action Medication Dose Rate   07/12/20 1509 New Bag/Given   metroNIDAZOLE (FLAGYL) IVPB 500 mg 500 mg 100 mL/hr   07/12/20 2115 New Bag/Given   metroNIDAZOLE (FLAGYL) IVPB 500 mg 500 mg 100 mL/hr   07/12/20 2145 New Bag/Given   ceFEPIme (MAXIPIME) 2 g in sodium chloride 0.9 % 100 mL  IVPB 2 g 200 mL/hr   07/13/20 0506 New Bag/Given   metroNIDAZOLE (FLAGYL) IVPB 500 mg 500 mg 100 mL/hr   07/13/20 1106 New Bag/Given   ceFEPIme (MAXIPIME) 2 g in sodium chloride 0.9 % 100 mL IVPB 2 g 200 mL/hr   07/13/20 1113 New Bag/Given   vancomycin (VANCOREADY) IVPB 750 mg/150 mL 750 mg 150 mL/hr   07/13/20 1337 New Bag/Given   metroNIDAZOLE  (FLAGYL) IVPB 500 mg 500 mg 100 mL/hr   07/13/20 2233 New Bag/Given   metroNIDAZOLE (FLAGYL) IVPB 500 mg 500 mg 100 mL/hr   07/14/20 0106 New Bag/Given  [assumed care of pt.  medication not given when scheduled by prior RN. med given by this RN. epic will not load in pts room-computer restarted.]   ceFEPIme (MAXIPIME) 2 g in sodium chloride 0.9 % 100 mL IVPB 2 g 200 mL/hr   07/14/20 0553 New Bag/Given   metroNIDAZOLE (FLAGYL) IVPB 500 mg 500 mg 100 mL/hr   07/14/20 1125 New Bag/Given   vancomycin (VANCOREADY) IVPB 750 mg/150 mL 750 mg 150 mL/hr   07/14/20 1125 Given   vancomycin (VANCOCIN) 500 MG powder 750 mg    07/14/20 1341 New Bag/Given   ceFEPIme (MAXIPIME) 2 g in sodium chloride 0.9 % 100 mL IVPB 2 g 200 mL/hr   07/14/20 1425 New Bag/Given   metroNIDAZOLE (FLAGYL) IVPB 500 mg 500 mg 100 mL/hr   07/14/20 2150 New Bag/Given   metroNIDAZOLE (FLAGYL) IVPB 500 mg 500 mg 100 mL/hr   07/14/20 2330 New Bag/Given   ceFEPIme (MAXIPIME) 2 g in sodium chloride 0.9 % 100 mL IVPB 2 g 200 mL/hr   07/15/20 0512 New Bag/Given   metroNIDAZOLE (FLAGYL) IVPB 500 mg 500 mg 100 mL/hr   07/15/20 1026 New Bag/Given   ceFEPIme (MAXIPIME) 2 g in sodium chloride 0.9 % 100 mL IVPB 2 g 200 mL/hr   07/15/20 1159 New Bag/Given   vancomycin (VANCOREADY) IVPB 750 mg/150 mL 750 mg 150 mL/hr   07/15/20 1311 New Bag/Given   metroNIDAZOLE (FLAGYL) IVPB 500 mg 500 mg 100 mL/hr         Subjective: Patient seen and examined.  No overnight events.  She is poor historian.  She says she is fine.  Patient denied any pain on her neck.  She could point to the left neck, however denied any pain.  Objective: Vitals:   07/14/20 2326 07/15/20 0315 07/15/20 0500 07/15/20 0802  BP: (!) 146/87 (!) 155/92  (!) 163/95  Pulse: 87 78  72  Resp: 17 16    Temp: 98.8 F (37.1 C) 98.5 F (36.9 C)  99.5 F (37.5 C)  TempSrc: Oral Oral  Oral  SpO2: 98% 98%  100%  Weight:   49.7 kg   Height:        Intake/Output Summary  (Last 24 hours) at 07/15/2020 1322 Last data filed at 07/15/2020 0500 Gross per 24 hour  Intake 1100.51 ml  Output 600 ml  Net 500.51 ml   Filed Weights   07/12/20 0431 07/14/20 2000 07/15/20 0500  Weight: 48.5 kg 49.7 kg 49.7 kg    Examination:  General exam: Appears calm and comfortable  Patient is alert but not oriented.  She has baseline cognitive dysfunction.  Follows simple commands. Obvious swelling on the left infra-auricular region with some edema, no fluctuating abscess. Respiratory system: Clear to auscultation. Respiratory effort normal. Cardiovascular system: S1 & S2 heard, RRR. No JVD, murmurs, rubs, gallops or clicks. No pedal edema.  Gastrointestinal system: Abdomen is nondistended, soft and nontender. No organomegaly or masses felt. Normal bowel sounds heard.  Data Reviewed: I have personally reviewed following labs and imaging studies  CBC: Recent Labs  Lab 07/12/20 0443 07/15/20 0215  WBC 20.0* 9.9  NEUTROABS 17.9*  --   HGB 12.4 10.4*  HCT 38.2 32.4*  MCV 87.4 88.0  PLT 340 276   Basic Metabolic Panel: Recent Labs  Lab 07/12/20 0443 07/14/20 0550 07/15/20 0215  NA 138 142 137  K 3.4* 3.2* 3.3*  CL 100 104 104  CO2 23 27 19*  GLUCOSE 241* 161* 157*  BUN 11 10 5*  CREATININE 0.71 0.61 0.65  CALCIUM 9.2 8.6* 8.6*  MG  --   --  1.5*   GFR: Estimated Creatinine Clearance: 59.4 mL/min (by C-G formula based on SCr of 0.65 mg/dL). Liver Function Tests: Recent Labs  Lab 07/12/20 0443  AST 24  ALT 25  ALKPHOS 65  BILITOT 0.8  PROT 7.6  ALBUMIN 4.4   No results for input(s): LIPASE, AMYLASE in the last 168 hours. No results for input(s): AMMONIA in the last 168 hours. Coagulation Profile: No results for input(s): INR, PROTIME in the last 168 hours. Cardiac Enzymes: No results for input(s): CKTOTAL, CKMB, CKMBINDEX, TROPONINI in the last 168 hours. BNP (last 3 results) No results for input(s): PROBNP in the last 8760 hours. HbA1C: Recent  Labs    07/15/20 0214  HGBA1C 6.4*   CBG: Recent Labs  Lab 07/14/20 2124 07/14/20 2324 07/15/20 0316 07/15/20 1006 07/15/20 1238  GLUCAP 132* 146* 154* 183* 151*   Lipid Profile: No results for input(s): CHOL, HDL, LDLCALC, TRIG, CHOLHDL, LDLDIRECT in the last 72 hours. Thyroid Function Tests: No results for input(s): TSH, T4TOTAL, FREET4, T3FREE, THYROIDAB in the last 72 hours. Anemia Panel: No results for input(s): VITAMINB12, FOLATE, FERRITIN, TIBC, IRON, RETICCTPCT in the last 72 hours. Sepsis Labs: Recent Labs  Lab 07/12/20 0443 07/12/20 0643 07/14/20 1323  LATICACIDVEN 4.7* 3.6* 1.1    Recent Results (from the past 240 hour(s))  Blood culture (routine x 2)     Status: None (Preliminary result)   Collection Time: 07/12/20  4:43 AM   Specimen: BLOOD  Result Value Ref Range Status   Specimen Description   Final    BLOOD LEFT ANTECUBITAL Performed at [facility], [address] [place], [place] [address]    Special Requests   Final    BOTTLES DRAWN AEROBIC AND ANAEROBIC Blood Culture adequate volume Performed at [facility], [address] [place], [place] [address]    Culture   Final    NO GROWTH 3 DAYS Performed at [facility], [address], [place], [place] [address]    Report Status PENDING  Incomplete  Blood culture (routine x 2)     Status: None (Preliminary result)   Collection Time: 07/12/20  4:48 AM   Specimen: BLOOD  Result Value Ref Range Status   Specimen Description   Final    BLOOD RIGHT ANTECUBITAL Performed at [facility], [address] [place], [place] [address]    Special Requests   Final    BOTTLES DRAWN AEROBIC AND ANAEROBIC Blood Culture adequate volume Performed at [facility], [address] [place], [place] [address]    Culture   Final    NO GROWTH 3 DAYS Performed at [facility], [address], [place],  [place] [address]    Report Status PENDING  Incomplete   SARS Coronavirus 2 by RT PCR (hospital order, performed in [facility] hospital lab) Nasopharyngeal Nasopharyngeal Swab     Status: None   Collection Time: 07/12/20  5:27 AM   Specimen: Nasopharyngeal Swab  Result Value Ref Range Status   SARS Coronavirus 2 NEGATIVE NEGATIVE Final    Comment: (NOTE) SARS-CoV-2 target nucleic acids are NOT DETECTED.  The SARS-CoV-2 RNA is generally detectable in upper and lower respiratory specimens during the acute phase of infection. The lowest concentration of SARS-CoV-2 viral copies this assay can detect is 250 copies / mL. A negative result does not preclude SARS-CoV-2 infection and should not be used as the sole basis for treatment or other patient management decisions.  A negative result may occur with improper specimen collection / handling, submission of specimen other than nasopharyngeal swab, presence of viral mutation(s) within the areas targeted by this assay, and inadequate number of viral copies (<250 copies / mL). A negative result must be combined with clinical observations, patient history, and epidemiological information.  Fact Sheet for Patients:   BoilerBrush.com.cyhttps://www.fda.gov/media/136312/download  Fact Sheet for Healthcare Providers: https://pope.com/https://www.fda.gov/media/136313/download  This test is not yet approved or  cleared by the [place] FDA and has been authorized for detection and/or diagnosis of SARS-CoV-2 by FDA under an Emergency Use Authorization (EUA).  This EUA will remain in effect (meaning this test can be used) for the duration of the COVID-19 declaration under Section 564(b)(1) of the Act, 21 U.S.C. section 360bbb-3(b)(1), unless the authorization is terminated or revoked sooner.  Performed at [facility] [place], [address] [place], [place] [address]          Radiology Studies: No results found.      Scheduled Meds: . enoxaparin (LOVENOX) injection  40 mg Subcutaneous Q24H  . insulin aspart  0-9  Units Subcutaneous Q4H  . potassium chloride  20 mEq Oral BID  . sodium chloride flush  3 mL Intravenous Q12H   Continuous Infusions: . ceFEPime (MAXIPIME) IV 2 g (07/15/20 1026)  . magnesium sulfate bolus IVPB    . metronidazole 500 mg (07/15/20 1311)     LOS: 1 day    Time spent: 32 minutes    Dorcas CarrowKuber Ivee Poellnitz, MD [facility] Pager [phone]

## 2020-07-15 NOTE — Progress Notes (Addendum)
Pharmacy Antibiotic Note  Jocelyn Simmons is a 59 y.o. female admitted on 07/12/2020 with sepsis.  Pharmacy has been consulted for Vancomycin/Cefepime dosing.  WBC is 9.9. Renal function ok. Tmax 99.5. Patient founf to have Sialadenitis- she has enlargement of the left submandibular gland.  Per Otolaryngology - repeat CT scan , if improved with no extension can be discharged on clindamycin for 10 days.  D/c Vancomycin per Dr. Jerral Ralph  Plan: For now, continue:  Cefepime 2g IV q12h Trend WBC, temp, renal function  F/U infectious work-up Drug levels as indicated   Height: 5\' 4"  (162.6 cm) Weight: 49.7 kg (109 lb 9.1 oz) IBW/kg (Calculated) : 54.7  Temp (24hrs), Avg:98.9 F (37.2 C), Min:98.5 F (36.9 C), Max:99.5 F (37.5 C)  Recent Labs  Lab 07/12/20 0443 07/12/20 0643 07/14/20 0550 07/14/20 1323 07/15/20 0215  WBC 20.0*  --   --   --  9.9  CREATININE 0.71  --  0.61  --  0.65  LATICACIDVEN 4.7* 3.6*  --  1.1  --     Estimated Creatinine Clearance: 59.4 mL/min (by C-G formula based on SCr of 0.65 mg/dL).    No Known Allergies  07/17/20, PharmD, [facility] Clinical Pharmacist Please see AMION for all Pharmacists' Contact Phone Numbers 07/15/2020, 1:06 PM

## 2020-07-16 ENCOUNTER — Inpatient Hospital Stay (HOSPITAL_COMMUNITY): Payer: Medicare Other

## 2020-07-16 DIAGNOSIS — J690 Pneumonitis due to inhalation of food and vomit: Secondary | ICD-10-CM

## 2020-07-16 LAB — BASIC METABOLIC PANEL
Anion gap: 8 (ref 5–15)
BUN: <5 mg/dL — ABNORMAL LOW (ref 6–20)
CO2: 26 mmol/L (ref 22–32)
Calcium: 8.9 mg/dL (ref 8.9–10.3)
Chloride: 100 mmol/L (ref 98–111)
Creatinine, Ser: 0.59 mg/dL (ref 0.44–1.00)
GFR calc Af Amer: >60 mL/min (ref >60)
GFR calc non Af Amer: >60 mL/min (ref >60)
Glucose, Bld: 233 mg/dL — ABNORMAL HIGH (ref 70–99)
Potassium: 3.3 mmol/L — ABNORMAL LOW (ref 3.5–5.1)
Sodium: 134 mmol/L — ABNORMAL LOW (ref 135–145)

## 2020-07-16 LAB — MAGNESIUM: Magnesium: 1.9 mg/dL (ref 1.7–2.4)

## 2020-07-16 LAB — CBC WITH DIFFERENTIAL/PLATELET
Abs Immature Granulocytes: 0.05 10*3/uL (ref 0.00–0.07)
Basophils Absolute: 0 10*3/uL (ref 0.0–0.1)
Basophils Relative: 0 %
Eosinophils Absolute: 0.1 10*3/uL (ref 0.0–0.5)
Eosinophils Relative: 1 %
HCT: 35.7 % — ABNORMAL LOW (ref 36.0–46.0)
Hemoglobin: 11.8 g/dL — ABNORMAL LOW (ref 12.0–15.0)
Immature Granulocytes: 1 %
Lymphocytes Relative: 18 %
Lymphs Abs: 1.4 10*3/uL (ref 0.7–4.0)
MCH: 27.7 pg (ref 26.0–34.0)
MCHC: 33.1 g/dL (ref 30.0–36.0)
MCV: 83.8 fL (ref 80.0–100.0)
Monocytes Absolute: 0.9 10*3/uL (ref 0.1–1.0)
Monocytes Relative: 12 %
Neutro Abs: 5.4 10*3/uL (ref 1.7–7.7)
Neutrophils Relative %: 68 %
Platelets: 351 10*3/uL (ref 150–400)
RBC: 4.26 MIL/uL (ref 3.87–5.11)
RDW: 12.2 % (ref 11.5–15.5)
WBC: 7.9 10*3/uL (ref 4.0–10.5)
nRBC: 0 % (ref 0.0–0.2)

## 2020-07-16 LAB — GLUCOSE, CAPILLARY
Glucose-Capillary: 201 mg/dL — ABNORMAL HIGH (ref 70–99)
Glucose-Capillary: 136 mg/dL — ABNORMAL HIGH (ref 70–99)
Glucose-Capillary: 314 mg/dL — ABNORMAL HIGH (ref 70–99)
Glucose-Capillary: 287 mg/dL — ABNORMAL HIGH (ref 70–99)
Glucose-Capillary: 289 mg/dL — ABNORMAL HIGH (ref 70–99)
Glucose-Capillary: 248 mg/dL — ABNORMAL HIGH (ref 70–99)

## 2020-07-16 LAB — PHOSPHORUS: Phosphorus: 1.3 mg/dL — ABNORMAL LOW (ref 2.5–4.6)

## 2020-07-16 MED ORDER — DEXTROMETHORPHAN-QUINIDINE 20-10 MG PO CAPS
1.0000 | ORAL_CAPSULE | Freq: Two times a day (BID) | ORAL | Status: DC
  Administered 2020-07-16 – 2020-07-17 (×3): 1 via ORAL
  Filled 2020-07-16 (×4): qty 1

## 2020-07-16 MED ORDER — AMLODIPINE BESYLATE 10 MG PO TABS
10.0000 mg | ORAL_TABLET | Freq: Every day | ORAL | Status: DC
  Administered 2020-07-16 – 2020-07-17 (×2): 10 mg via ORAL
  Filled 2020-07-16 (×2): qty 1

## 2020-07-16 MED ORDER — PANTOPRAZOLE SODIUM 40 MG PO TBEC
40.0000 mg | DELAYED_RELEASE_TABLET | Freq: Every day | ORAL | Status: DC
  Administered 2020-07-17: 40 mg via ORAL
  Filled 2020-07-16: qty 1

## 2020-07-16 MED ORDER — ARIPIPRAZOLE 5 MG PO TABS
5.0000 mg | ORAL_TABLET | Freq: Every day | ORAL | Status: DC
  Administered 2020-07-16 – 2020-07-17 (×2): 5 mg via ORAL
  Filled 2020-07-16 (×2): qty 1

## 2020-07-16 MED ORDER — TRAZODONE HCL 50 MG PO TABS
50.0000 mg | ORAL_TABLET | Freq: Every day | ORAL | Status: DC
  Administered 2020-07-16: 100 mg via ORAL
  Filled 2020-07-16: qty 2

## 2020-07-16 MED ORDER — LORAZEPAM 0.5 MG PO TABS
0.5000 mg | ORAL_TABLET | Freq: Two times a day (BID) | ORAL | Status: DC | PRN
  Administered 2020-07-16: 1 mg via ORAL
  Filled 2020-07-16: qty 2

## 2020-07-16 MED ORDER — IOHEXOL 300 MG/ML  SOLN
75.0000 mL | Freq: Once | INTRAMUSCULAR | Status: AC | PRN
  Administered 2020-07-16: 75 mL via INTRAVENOUS

## 2020-07-16 MED ORDER — BUSPIRONE HCL 10 MG PO TABS
15.0000 mg | ORAL_TABLET | Freq: Two times a day (BID) | ORAL | Status: DC
  Administered 2020-07-16 – 2020-07-17 (×3): 15 mg via ORAL
  Filled 2020-07-16 (×3): qty 2

## 2020-07-16 MED ORDER — K PHOS MONO-SOD PHOS DI & MONO 155-852-130 MG PO TABS
500.0000 mg | ORAL_TABLET | Freq: Four times a day (QID) | ORAL | Status: DC
  Administered 2020-07-16 – 2020-07-17 (×4): 500 mg via ORAL
  Filled 2020-07-16 (×3): qty 2

## 2020-07-16 MED ORDER — PAROXETINE HCL 20 MG PO TABS
40.0000 mg | ORAL_TABLET | Freq: Every day | ORAL | Status: DC
  Administered 2020-07-16: 40 mg via ORAL
  Filled 2020-07-16 (×2): qty 2

## 2020-07-16 NOTE — TOC Initial Note (Signed)
Transition of Care [facility]) - Initial/Assessment Note    Patient Details  Name: Jocelyn Simmons MRN: [id number] Date of Birth: Sep 15, 1961  [facility] CM/SW Contact:    Joanne Chars, LCSW Phone Number: 07/16/2020, 3:05 PM  Clinical Narrative:     CSW met with pt sister, Rebecca Eaton, who is also the legal guardian. Pt was not in the room, currently at a procedure. CSW requested copy of guardianship order and Rennis Golden will try to provide one. Loraine reports she has been guardian since 2006.    Loraine reports no current services in the home and no needs that she is aware of.  PCP is Dr Tempie Donning. Pt has a wheelchair in the home but does not need it and Loraine does not believe there are any equipment needs.  Pt is vaccinated, per Loraine.  No consults from PT/OT and no needs identified at this time.    Expected Discharge Plan: Home/Self Care Barriers to Discharge: No Barriers Identified   Patient Goals and CMS Choice        Expected Discharge Plan and Services Expected Discharge Plan: Home/Self Care     Post Acute Care Choice: NA Living arrangements for the past 2 months: Single Family Home                                      Prior Living Arrangements/Services Living arrangements for the past 2 months: Single Family Home Lives with:: Siblings Edwena Felty, sister/legal guardian)   Do you feel safe going back to the place where you live?: Yes      Need for Family Participation in Patient Care: Yes (Comment) Care giver support system in place?: Yes (comment)   Criminal Activity/Legal Involvement Pertinent to Current Situation/Hospitalization: No - Comment as needed  Activities of Daily Living Home Assistive Devices/Equipment: None (per sister) ADL Screening (condition at time of admission) Patient's cognitive ability adequate to safely complete daily activities?: No Is the patient deaf or have difficulty hearing?: No Does the patient have difficulty  seeing, even when wearing glasses/contacts?: Yes Does the patient have difficulty concentrating, remembering, or making decisions?: Yes Patient able to express need for assistance with ADLs?: No Does the patient have difficulty dressing or bathing?: Yes Independently performs ADLs?: No Communication: Needs assistance Is this a change from baseline?: Pre-admission baseline Dressing (OT): Needs assistance Is this a change from baseline?: Pre-admission baseline Grooming: Needs assistance Is this a change from baseline?: Pre-admission baseline Feeding: Needs assistance Is this a change from baseline?: Pre-admission baseline Bathing: Dependent Is this a change from baseline?: Pre-admission baseline Toileting: Dependent Is this a change from baseline?: Pre-admission baseline In/Out Bed: Needs assistance Is this a change from baseline?: Pre-admission baseline Walks in Home: Appropriate for developmental age Does the patient have difficulty walking or climbing stairs?: No Weakness of Legs: None Weakness of Arms/Hands: None  Permission Sought/Granted                  Emotional Assessment Appearance::  (pt not present, at a procedure) Attitude/Demeanor/Rapport: Unable to Assess Affect (typically observed): Unable to Assess   Alcohol / Substance Use: Not Applicable Psych Involvement: No (comment)  Admission diagnosis:  Sialoadenitis [K11.20] Severe sepsis (HCC) [A41.9, R65.20] Aspiration pneumonia of left upper lobe, unspecified aspiration pneumonia type ([place]) [J69.0] Cough with fever [R05, R50.9] Patient Active Problem List   Diagnosis Date Noted   Severe sepsis ([place]) 07/14/2020  Insulin dependent type 2 diabetes mellitus ([place])    Hypokalemia    Hyperlipidemia associated with type 2 diabetes mellitus ([place])    Depression with anxiety    Aspiration pneumonia of left upper lobe ([place])    Sialoadenitis 07/12/2020   Type 2 diabetes mellitus with ketoacidosis without  coma, without long-term current use of insulin ([place])    Hypertension associated with diabetes ([place])    DKA (diabetic ketoacidoses) ([place]) 08/19/2016   Developmental delay disorder 08/19/2016   PCP:  [facility], [facility]:   [facility], [address] [place] [address] Phone: [phone] Fax: [phone]  [id number] [place], [address] [place] [address] Phone: [phone] Fax: [phone]     Social Determinants of Health (SDOH) Interventions    Readmission Risk Interventions No flowsheet data found.

## 2020-07-16 NOTE — Progress Notes (Signed)
Spoke with Pt's guardian, sister Karin Golden with pt updates. Pt resting comfortably, ate half of dinner and ice cream, No changes in status.

## 2020-07-16 NOTE — Progress Notes (Addendum)
Subjective: Resting comfortably in bed. No complaint. Guardian reports that the patient's facial and submandibular swelling have significantly improved.  Objective: Vital signs in last 24 hours: Temp:  [97.6 F (36.4 C)-98.1 F (36.7 C)] 98.1 F (36.7 C) (08/20 2032) Pulse Rate:  [72-94] 89 (08/20 2032) Resp:  [11-24] 22 (08/20 2032) BP: (148-183)/(81-108) 148/84 (08/20 2032) SpO2:  [98 %] 98 % (08/20 2032) Weight:  [47.9 kg-48 kg] 47.9 kg (08/20 0623)  General appearance: alert and cooperative. Nonverbal. Head: Normocephalic, without obvious abnormality, atraumatic Ears: Examination of the ears shows normal auricles and external auditory canals bilaterally. Nose: Nasal examination shows normal mucosa, septum, turbinates.  Face: Facial examination shows no asymmetry. Palpation of the face elicit no significant tenderness.  Mouth: Oral cavity examination shows no mucosal edema or erythema. Floor of mouth is soft to palpation. Neck: Moderate induration of the left submandibular gland is noted. (Guardian reports significant improvement in her swelling over the past 2 days). No skin erythema.   Recent Labs    07/15/20 0215 07/16/20 0120  WBC 9.9 7.9  HGB 10.4* 11.8*  HCT 32.4* 35.7*  PLT 276 351   Recent Labs    07/15/20 0215 07/16/20 0120  NA 137 134*  K 3.3* 3.3*  CL 104 100  CO2 19* 26  GLUCOSE 157* 233*  BUN 5* <5*  CREATININE 0.65 0.59  CALCIUM 8.6* 8.9    Medications:  I have reviewed the patient's current medications. Scheduled: . amLODipine  10 mg Oral Daily  . ARIPiprazole  5 mg Oral Daily  . busPIRone  15 mg Oral BID  . Dextromethorphan-quiNIDine  1 capsule Oral BID  . enoxaparin (LOVENOX) injection  40 mg Subcutaneous Q24H  . insulin aspart  0-9 Units Subcutaneous Q4H  . pantoprazole  40 mg Oral Daily  . PARoxetine  40 mg Oral QHS  . phosphorus  500 mg Oral QID  . sodium chloride flush  3 mL Intravenous Q12H  . traZODone  50-100 mg Oral QHS    Continuous: . ceFEPime (MAXIPIME) IV 2 g (07/16/20 2053)  . metronidazole 500 mg (07/16/20 2045)    Assessment/Plan: The patient's submandibular sialadenitis has significantly improved.  Neck CTs from today and the 16th are reviewed. - The small parapharyngeal hypodensity (possible abscess) likely represents maturation of her infection. - With the normal WBC and the clinical improvement, may d/c home on oral clindamycin (300mg  QID for 10days). - She may follow up with me as an outpatient in 1 week.   LOS: 2 days   Dequante Tremaine W Savilla Turbyfill 07/16/2020, 10:19 PM

## 2020-07-16 NOTE — Progress Notes (Signed)
PROGRESS NOTE    Jocelyn Simmons  IFO:[id number] DOB: 1961-06-05 DOA: 07/12/2020 PCP: [facility]   Brief Narrative: Jocelyn Simmons is a 59 y.o. female with history of insulin-dependent diabetes, hypertension, hyperlipidemia, anxiety depression and intellectual disability. Patient presented secondary to left facial swelling and found to have sialoadenitis with evidence of sepsis and aspiration pneumonia. Antibiotics initiated.   Assessment & Plan:   Principal Problem:   Severe sepsis (HCC) Active Problems:   Hypertension associated with diabetes (HCC)   Sialoadenitis   Insulin dependent type 2 diabetes mellitus (HCC)   Hypokalemia   Hyperlipidemia associated with type 2 diabetes mellitus (HCC)   Depression with anxiety   Aspiration pneumonia of left upper lobe (HCC)   Severe sepsis Present on admission. Secondary to sialoadenitis. Blood cultures with no growth. No abscess noted on CT scan. Sepsis physiology resolved.  Sialoadenitis Left sided. Patient managed initially with vancomycin/Cefepime/Flagyl with significant improvement. Repeat CT scan on 8/20 significant for improved inflammation with newly developed abscess and new right upper lung infiltrate. Discharge with Clindamycin per ENT recommendations with outpatient ENT follow-up.  Aspiration pneumonia Noted on CT scan of the neck. Speech therapy consulted and recommended a dysphagia 1 diet (puree): Diet Recommendation Thin liquid;Dysphagia 1 (Puree)   Liquid Administration via: Cup;Straw Medication Administration: Crushed with puree Supervision: Staff to assist with self feeding;Full supervision/cueing for compensatory strategies Compensations: Minimize environmental distractions;Slow rate;Small sips/bites Postural Changes: Seated upright at 90 degrees    Diabetes mellitus, type 2 Patient is on Metformin, Januvia and Humalog SSI as an outpatient.  Hyperlipidemia On Lipitor as an  outpatient  Essential hypertension On amlodipine as an outpatient  Depression Anxiety Continue Abilify, Buspar, Ativan, Neudexta, Paxil, Trazodone  Developmental delay disorder Noted. Stable.    DVT prophylaxis: Lovenox Code Status:   Code Status: Full Code Family Communication: Sister on telephone Disposition Plan: Discharge home pending ENT recommendations for new CT findings (re-consulted 8/20). Unsure of timeframe; dependent on ENT recommendations.   Consultants:   ENT  Procedures:   None  Antimicrobials:  Vancomycin  Cefepime  Flagyl    Subjective: Not able to communicate with me.  Objective: Vitals:   07/16/20 0546 07/16/20 0623 07/16/20 0813 07/16/20 1158  BP:   (!) 183/81 (!) 182/108  Pulse:   72 80  Resp:   (!) 24 11  Temp:   97.6 F (36.4 C)   TempSrc:   Oral   SpO2:   98% 98%  Weight: 48 kg 47.9 kg    Height:        Intake/Output Summary (Last 24 hours) at 07/16/2020 1432 Last data filed at 07/16/2020 0700 Gross per 24 hour  Intake 80 ml  Output 300 ml  Net -220 ml   Filed Weights   07/15/20 0500 07/16/20 0546 07/16/20 0623  Weight: 49.7 kg 48 kg 47.9 kg    Examination:  General exam: Appears calm and comfortable HEENT: submandibular mass palpated with no fluctuance Respiratory system: Clear to auscultation. Respiratory effort normal. Cardiovascular system: S1 & S2 heard, RRR. No murmurs, rubs, gallops or clicks. Gastrointestinal system: Abdomen is nondistended, soft and nontender. No organomegaly or masses felt. Normal bowel sounds heard. Central nervous system: Alert. No focal neurological deficits. Musculoskeletal: No edema. No calf tenderness Skin: No cyanosis. No rashes     Data Reviewed: I have personally reviewed following labs and imaging studies  CBC Lab Results  Component Value Date   WBC 7.9 07/16/2020   RBC 4.26  07/16/2020   HGB 11.8 (L) 07/16/2020   HCT 35.7 (L) 07/16/2020   MCV 83.8 07/16/2020   MCH 27.7  07/16/2020   PLT 351 07/16/2020   MCHC 33.1 07/16/2020   RDW 12.2 07/16/2020   LYMPHSABS 1.4 07/16/2020   MONOABS 0.9 07/16/2020   EOSABS 0.1 07/16/2020   BASOSABS 0.0 07/16/2020     Last metabolic panel Lab Results  Component Value Date   NA 134 (L) 07/16/2020   K 3.3 (L) 07/16/2020   CL 100 07/16/2020   CO2 26 07/16/2020   BUN <5 (L) 07/16/2020   CREATININE 0.59 07/16/2020   GLUCOSE 233 (H) 07/16/2020   GFRNONAA >60 07/16/2020   GFRAA >60 07/16/2020   CALCIUM 8.9 07/16/2020   PHOS 1.3 (L) 07/16/2020   PROT 7.6 07/12/2020   ALBUMIN 4.4 07/12/2020   BILITOT 0.8 07/12/2020   ALKPHOS 65 07/12/2020   AST 24 07/12/2020   ALT 25 07/12/2020   ANIONGAP 8 07/16/2020    CBG (last 3)  Recent Labs    07/16/20 0411 07/16/20 0814 07/16/20 1146  GLUCAP 201* 136* 314*     GFR: Estimated Creatinine Clearance: 57.3 mL/min (by C-G formula based on SCr of 0.59 mg/dL).  Coagulation Profile: No results for input(s): INR, PROTIME in the last 168 hours.  Recent Results (from the past 240 hour(s))  Blood culture (routine x 2)     Status: None (Preliminary result)   Collection Time: 07/12/20  4:43 AM   Specimen: BLOOD  Result Value Ref Range Status   Specimen Description   Final    BLOOD LEFT ANTECUBITAL Performed at [facility], [address] [place], [place] [address]    Special Requests   Final    BOTTLES DRAWN AEROBIC AND ANAEROBIC Blood Culture adequate volume Performed at [facility], [address] [place], [place] [address]    Culture   Final    NO GROWTH 4 DAYS Performed at [facility], [address], [place], [place] [address]    Report Status PENDING  Incomplete  Blood culture (routine x 2)     Status: None (Preliminary result)   Collection Time: 07/12/20  4:48 AM   Specimen: BLOOD  Result Value Ref Range Status   Specimen Description   Final    BLOOD RIGHT ANTECUBITAL Performed at [facility], [address] [place], [place] [address]    Special Requests   Final    BOTTLES DRAWN AEROBIC AND ANAEROBIC Blood Culture adequate volume Performed at [facility], [address] [place], [place] [address]    Culture   Final    NO GROWTH 4 DAYS Performed at [facility], [address], [place], [place] [address]    Report Status PENDING  Incomplete  SARS Coronavirus 2 by RT PCR (hospital order, performed in [facility] hospital lab) Nasopharyngeal Nasopharyngeal Swab     Status: None   Collection Time: 07/12/20  5:27 AM   Specimen: Nasopharyngeal Swab  Result Value Ref Range Status   SARS Coronavirus 2 NEGATIVE NEGATIVE Final    Comment: (NOTE) SARS-CoV-2 target nucleic acids are NOT DETECTED.  The SARS-CoV-2 RNA is generally detectable in upper and lower respiratory specimens during the acute phase of infection. The lowest concentration of SARS-CoV-2 viral copies this assay can detect is 250 copies / mL. A negative result does not preclude SARS-CoV-2 infection and should not be used as  the sole basis for treatment or other patient management decisions.  A negative result may occur with improper specimen collection / handling, submission of specimen other than nasopharyngeal swab, presence of viral mutation(s) within the areas targeted by this assay, and inadequate number of viral copies (<250 copies / mL). A negative result must be combined with clinical observations, patient history, and epidemiological information.  Fact Sheet for Patients:   BoilerBrush.com.cy  Fact Sheet for Healthcare Providers: https://pope.com/  This test is not yet approved or  cleared by the [place] FDA and has been authorized for detection and/or diagnosis of SARS-CoV-2 by FDA under an Emergency Use Authorization (EUA).  This EUA will remain in effect (meaning this test can be used) for the duration of the COVID-19 declaration under Section  564(b)(1) of the Act, 21 U.S.C. section 360bbb-3(b)(1), unless the authorization is terminated or revoked sooner.  Performed at [facility], [address] [place], [place] [address]         Radiology Studies: No results found.      Scheduled Meds:  ARIPiprazole  5 mg Oral Daily   busPIRone  15 mg Oral BID   Dextromethorphan-quiNIDine  1 capsule Oral BID   enoxaparin (LOVENOX) injection  40 mg Subcutaneous Q24H   insulin aspart  0-9 Units Subcutaneous Q4H   pantoprazole  40 mg Oral Daily   PARoxetine  40 mg Oral QHS   phosphorus  500 mg Oral QID   potassium chloride  20 mEq Oral BID   sodium chloride flush  3 mL Intravenous Q12H   traZODone  50-100 mg Oral QHS   Continuous Infusions:  ceFEPime (MAXIPIME) IV 2 g (07/16/20 0931)   metronidazole 500 mg (07/16/20 0626)     LOS: 2 days     Jacquelin Hawking, MD [facility] 07/16/2020, 2:32 PM  If 7PM-7AM, please contact night-coverage [email or web address]

## 2020-07-17 DIAGNOSIS — E1159 Type 2 diabetes mellitus with other circulatory complications: Secondary | ICD-10-CM

## 2020-07-17 DIAGNOSIS — I10 Essential (primary) hypertension: Secondary | ICD-10-CM

## 2020-07-17 DIAGNOSIS — E1169 Type 2 diabetes mellitus with other specified complication: Secondary | ICD-10-CM

## 2020-07-17 DIAGNOSIS — F418 Other specified anxiety disorders: Secondary | ICD-10-CM

## 2020-07-17 DIAGNOSIS — E785 Hyperlipidemia, unspecified: Secondary | ICD-10-CM

## 2020-07-17 LAB — GLUCOSE, CAPILLARY
Glucose-Capillary: 198 mg/dL — ABNORMAL HIGH (ref 70–99)
Glucose-Capillary: 251 mg/dL — ABNORMAL HIGH (ref 70–99)
Glucose-Capillary: 325 mg/dL — ABNORMAL HIGH (ref 70–99)

## 2020-07-17 LAB — CULTURE, BLOOD (ROUTINE X 2)
Culture: NO GROWTH
Culture: NO GROWTH
Special Requests: ADEQUATE
Special Requests: ADEQUATE

## 2020-07-17 MED ORDER — AMOXICILLIN-POT CLAVULANATE 875-125 MG PO TABS: 1.0000 | ORAL_TABLET | Freq: Two times a day (BID) | ORAL | 0 refills | Status: AC

## 2020-07-17 NOTE — Discharge Instructions (Signed)
Jocelyn Simmons,  You were in the hospital because of an infection of one of the glands in your jaw in addition to pneumonia. You have been treated with antibiotics which have improved your symptoms. You will continue antibiotics on discharge.   Recommendations for Outpatient Follow-up:  1. Follow up with PCP in 1 week 2. Follow up with ENT in 1 week 3. Please obtain BMP/CBC in one week 4. Consider repeat CXR in 3-4 weeks 5. Please follow up on the following pending results: None   Aspiration Pneumonia Aspiration pneumonia is an infection in the lungs. It occurs when saliva or liquid contaminated with bacteria is inhaled (aspirated) into the lungs. When these things get into the lungs, swelling (inflammation) and infection can occur. This can make it difficult to breathe. Aspiration pneumonia is a serious condition and can be life threatening. What are the causes? This condition is caused when saliva or liquid from the mouth, throat, or stomach is inhaled into the lungs, and when those fluids are contaminated with bacteria. What increases the risk? The following factors may make you more likely to develop this condition:  A narrowing of the tube that carries food to the stomach (esophageal narrowing).  Having gastroesophageal reflux disease (GERD).  Having a weak immune system.  Having diabetes.  Having poor oral hygiene.  Being malnourished. The condition is more likely to occur when a person's cough (gag) reflex, or ability to swallow, has decreased. Some things that can cause this decrease include:  Having a brain injury or disease, such as stroke, seizures, Parkinson disease, dementia, or amyotrophic lateral sclerosis (ALS).  Being given a general anesthetic for procedures.  Drinking too much alcohol. If a person passes out and vomits, vomit can be inhaled into the lungs.  Taking certain medicines, such as tranquilizers or sedatives. What are the signs or  symptoms? Symptoms of this condition include:  Fever.  A cough with secretions that are yellow, tan, or green.  Breathing problems, such as wheezing or shortness of breath.  Chest pain.  Being more tired than usual (fatigue).  Having a history of coughing while eating or drinking.  Bad breath.  Bluish color to the lips, skin, or fingers. How is this diagnosed? This condition may be diagnosed based on:  A physical exam.  Tests, such as: ? Chest X-ray. ? Sputum culture. Saliva and mucus (sputum) are collected from the lungs or the tubes that carry air to the lungs (bronchi). The sputum is then tested for bacteria. ? Oximetry. A sensor or clip is placed on areas such as a finger, earlobe, or toe to measure the oxygen level in your blood. ? Blood tests. ? Swallowing study. This test looks at how food is swallowed and whether it goes into your breathing tube (trachea) or esophagus. ? Bronchoscopy. This test uses a flexible tube (bronchoscope) to see inside the lungs. How is this treated? This condition may be treated with:  Medicines. Antibiotic medicine will be given to kill the pneumonia bacteria. Other medicines may also be used to reduce fever or pain.  Breathing assistance and oxygen therapy. Depending on how well you are breathing, you may need to be given oxygen, or you may need breathing support from a breathing machine (ventilator).  Thoracentesis. This is a procedure to remove fluid that has built up in the space between the linings of the chest wall and the lungs.  Feeding tube and diet change. For people who have difficulty swallowing, a feeding tube might  be placed in the stomach, or they may be asked to avoid certain food textures or liquids when eating. Follow these instructions at home: Medicines  Take over-the-counter and prescription medicines only as told by your health care provider. ? If you were prescribed an antibiotic medicine, take it as told by your  health care provider. Do not stop taking the antibiotic even if you start to feel better. ? Take cough medicine only if you are losing sleep. Cough medicine can prevent your body's natural ability to remove mucus from your lungs. General instructions  Carefully follow any eating instructions you were given, such as avoiding certain food textures or thickening your liquids. Thickening liquids reduces the risk of developing aspiration pneumonia again.  Use breathing exercises such as postural drainage, deep breathing, and incentive spirometry to help expel secretions.  Rest as instructed by your health care provider.  Sleep in a semi-upright position at night. Try to sleep in a reclining chair, or place a few pillows under your head.  Do not use any products that contain nicotine or tobacco, such as cigarettes and e-cigarettes. If you need help quitting, ask your health care provider.  Keep all follow-up visits as told by your health care provider. This is important. Contact a health care provider if:  You have a fever.  You have a worsening cough with yellow, tan, or green secretions.  You have coughing while eating or drinking. Get help right away if:  You have worsening shortness of breath, wheezing, or difficulty breathing.  You have chest pain. Summary  Aspiration pneumonia is an infection in the lungs. It is caused when saliva or liquid from the mouth, throat, or stomach is inhaled into the lungs.  Aspiration pneumonia is more likely to occur when a person's cough reflex or ability to swallow has decreased.  Symptoms of aspiration pneumonia include coughing, breathing problems, fever, and chest pain.  Aspiration pneumonia may be treated with antibiotic medicine, other medicines to reduce pain or fever, and breathing assistance or oxygen therapy. This information is not intended to replace advice given to you by your health care provider. Make sure you discuss any questions you  have with your health care provider. Document Revised: 10/26/2017 Document Reviewed: 12/19/2016 Elsevier Patient Education  2020 [employer].

## 2020-07-17 NOTE — Discharge Summary (Signed)
Physician Discharge Summary  Jocelyn Simmons KGY:[id number] DOB: [birth year] DOA: 07/12/2020  PCP: [facility]  Admit date: 07/12/2020 Discharge date: 07/17/2020  Admitted From: Home Disposition: Home  Recommendations for Outpatient Follow-up:  1. Follow up with PCP in 1 week 2. Follow up with ENT in 1 week 3. Please obtain BMP/CBC in one week 4. Consider repeat CXR in 3-4 weeks 5. Please follow up on the following pending results: None  Home Health: None Equipment/Devices: None  Discharge Condition: Stable CODE STATUS: Full code Diet recommendation: Regular   Brief/Interim Summary:  Admission HPI written by Zada Finders   Chief Complaint: Left neck swelling  HPI: Jocelyn Simmons is a 59 y.o. female with medical history significant for insulin-dependent type 2 diabetes, HTN, HLD, anxiety/depression, and intellectual disability who initially presented to [place] ED on 07/12/2020 for evaluation of left neck swelling.  History limited from patient due to intellectual disability therefore is otherwise supplemented by chart review.  Patient was brought to [place] ED due to family noticing swelling to the left side of her neck.  She has had associated cough and was not speaking or interacting as much as she usually does.  [facility] ED Course:  Initial vitals showed BP 126/96, pulse 112, RR 20, temp 101.0 Fahrenheit, SPO2 97% on room air.  Labs notable for WBC 20, hemoglobin 12.4, platelets 340,000, sodium 138, potassium 3.4, bicarb 23, BUN 11, creatinine 0.71, LFTs within normal limits.  Lactic acid 4.7 >> 3.6.  Urinalysis negative for UTI.  SARS-CoV-2 PCR negative.  Blood cultures collected 07/12/2020 no growth to date.  CT soft tissue neck with contrast showed left submandibular sialoadenitis with prominent swelling involving the submucosal space of the left pharynx and supraglottic larynx without organized collection.  Gas  tracking in the left parapharyngeal space is noted.  Partially covered left lower lobe airspace disease also seen.  Portable chest x-ray showed patchy infiltrate on the left.  Patient was given 1 L normal saline, 1.5 L LR and started on empiric IV vancomycin, cefepime, and Flagyl.  EDP discussed with on-call ENT who recommended continued IV antibiotics to notify of arrival after transfer.  The hospitalist service was consulted to admit for further management.  While holding in the ED, patient was noted to desaturate and was placed on supplemental O2 with improvement.  Patient was also given IV K 10 mEq x 2 for potassium 3.2 morning of 07/14/2020.  Lactic acid improved to 1.1.  Patient also started on maintenance IV fluids.   Hospital course:  Severe sepsis Present on admission. Secondary to sialoadenitis. Blood cultures with no growth. No abscess noted on CT scan. Sepsis physiology resolved.  Sialoadenitis Left sided. Patient managed initially with vancomycin/Cefepime/Flagyl with significant improvement. Repeat CT scan on 8/20 significant for improved inflammation with newly developed abscess and new right upper lung infiltrate. Discharge with Clindamycin per ENT recommendations with outpatient ENT follow-up. Since patient has associated aspiration pneumonia, will use Augmentin 875 mg-125 mg x 10 days  Aspiration pneumonia Noted on CT scan of the neck. Speech therapy consulted and recommended a dysphagia 1 diet (puree): Diet Recommendation Thin liquid;Dysphagia 1 (Puree)  Liquid Administration via: Cup;Straw Medication Administration: Crushed with puree Supervision: Staff to assist with self feeding;Full supervision/cueing for compensatory strategies Compensations: Minimize environmental distractions;Slow rate;Small sips/bites Postural Changes: Seated upright at 90 degrees   Diabetes mellitus, type 2 Patient is on Metformin, Januvia and Humalog SSI as an  outpatient.  Hyperlipidemia On Lipitor as an outpatient  Essential hypertension On amlodipine as an outpatient  Depression Anxiety Continue Abilify, Buspar, Ativan, Neudexta, Paxil, Trazodone  Developmental delay disorder Noted. Stable.   Discharge Diagnoses:  Principal Problem:   Severe sepsis ([place]) Active Problems:   Hypertension associated with diabetes (HCC)   Sialoadenitis   Insulin dependent type 2 diabetes mellitus (HCC)   Hypokalemia   Hyperlipidemia associated with type 2 diabetes mellitus ([place])   Depression with anxiety   Aspiration pneumonia of left upper lobe [facility])    Discharge Instructions  Discharge Instructions    Call MD for:  difficulty breathing, headache or visual disturbances   Complete by: As directed    Call MD for:  temperature >100.4   Complete by: As directed    Increase activity slowly   Complete by: As directed      Allergies as of 07/17/2020   No Known Allergies     Medication List    TAKE these medications   amLODipine 5 MG tablet Commonly known as: NORVASC Take 10 mg by mouth daily.   amoxicillin-clavulanate 875-125 MG tablet Commonly known as: Augmentin Take 1 tablet by mouth 2 (two) times daily for 10 days.   ARIPiprazole 5 MG tablet Commonly known as: ABILIFY Take 5 mg by mouth See admin instructions. Take 2.65m daily, May increase to 526mdaily as needed for mood control.   atorvastatin 20 MG tablet Commonly known as: LIPITOR Take 20 mg by mouth at bedtime.   blood glucose meter kit and supplies Dispense based on patient and insurance preference. Use up to four times daily as directed. (FOR ICD-9 250.00, 250.01).   busPIRone 15 MG tablet Commonly known as: BUSPAR Take 15 mg by mouth 2 (two) times daily.   HumaLOG KwikPen 100 UNIT/ML KwikPen Generic drug: insulin lispro Inject 0-10 Units into the skin See admin instructions. Sliding Scale If BS is 0-100= 0 units, 100-200=4 units 201-300=6units 301-350=8 units   >350=10 units   lisinopril 5 MG tablet Commonly known as: ZESTRIL Take 5 mg by mouth daily.   LORazepam 1 MG tablet Commonly known as: ATIVAN Take 0.5-1 mg by mouth 2 (two) times daily as needed for anxiety (extreme agitation).   metFORMIN 1000 MG tablet Commonly known as: Glucophage Take 1 tablet (1,000 mg total) by mouth 2 (two) times daily with a meal.   Nuedexta 20-10 MG capsule Generic drug: Dextromethorphan-quiNIDine Take 1 capsule by mouth 2 (two) times daily.   pantoprazole 40 MG tablet Commonly known as: PROTONIX Take 40 mg by mouth daily.   PARoxetine 40 MG tablet Commonly known as: PAXIL Take 40 mg by mouth at bedtime.   sitaGLIPtin 50 MG tablet Commonly known as: JANUVIA Take 50 mg by mouth daily.   traZODone 100 MG tablet Commonly known as: DESYREL Take 50-100 mg by mouth at bedtime. For Unsteady gait/dizziness       Follow-up Information    TeLeta BaptistMD. Schedule an appointment as soon as possible for a visit in 1 week(s).   Specialty: Otolaryngology Why: Sialoadenitis/abscess Contact information: [address] [place] [phone]        [facility]. Schedule an appointment as soon as possible for a visit in 1 week(s).   Specialty: Pediatrics Why: Hospital follow-up Contact information: [address] [phone]            No Known Allergies  Consultations:  ENT   Procedures/Studies: CT SOFT TISSUE NECK W CONTRAST  Result Date: 07/16/2020 CLINICAL DATA:  Left neck swelling.  Elevated white count. EXAM: CT NECK WITH CONTRAST TECHNIQUE: Multidetector CT imaging of the neck was performed using the standard protocol following the bolus administration of intravenous contrast. CONTRAST:  63m OMNIPAQUE IOHEXOL 300 MG/ML  SOLN COMPARISON:  07/12/2020 FINDINGS: Pharynx and larynx: There is less edema and swelling of the left submandibular gland and less edema  in the surrounding submandibular space. Persistent evidence of inflammatory disease in the left parapharyngeal space with a 2 cm abscess at the level of the angle of the mandible. Smaller abscess superior to that at the level of the oropharynx, measuring about 1 cm. Salivary glands: Parotid glands are normal. Right submandibular gland is normal. As noted above, there is diminishing inflammation of the left submandibular gland and surrounding soft tissues. Thyroid: 13 mm thyroid nodule on the right. Lymph nodes: Mild reactive enlargement of level 2 nodes on the left without suppuration. Vascular: Negative Limited intracranial: Normal Visualized orbits: Negative Mastoids and visualized paranasal sinuses: Clear Skeleton: Pronounced cervical spondylosis. Previous posterior decompression C4 and C5. Upper chest: Newly seen patchy pulmonary infiltrates in the right upper lobe consistent with bronchopneumonia. Left upper lobe is now clear. Other: None IMPRESSION: Decrease in inflammatory change of the left submandibular gland itself and of the surrounding soft tissues. Development of a 2 cm parapharyngeal abscess on the left at the level of the angle of the mandible. Smaller 1 cm left parapharyngeal abscess at the level of the oropharynx. Newly seen patchy infiltrates in the right upper lobe consistent with bronchopneumonia. 13 mm thyroid nodule on the right. Not clinically significant; no follow-up imaging recommended (ref: J Am Coll Radiol. 2015 Feb;12(2): 143-50). Electronically Signed   By: MNelson ChimesM.D.   On: 07/16/2020 14:53   CT Soft Tissue Neck W Contrast  Result Date: 07/12/2020 CLINICAL DATA:  Fever and left neck swelling. EXAM: CT NECK WITH CONTRAST TECHNIQUE: Multidetector CT imaging of the neck was performed using the standard protocol following the bolus administration of intravenous contrast. CONTRAST:  756mOMNIPAQUE IOHEXOL 300 MG/ML  SOLN COMPARISON:  None. FINDINGS: Pharynx and larynx: Submucosal  edema along the left pharynx and supraglottic larynx walls. No mucosal hyperenhancement. Salivary glands: Edema surrounds the enlarged left submandibular gland. Associated prominent proximal duct without detected stone. Contiguous patchy parapharyngeal gas on the left. No organized collection is seen. Thyroid: 7 mm right thyroid nodule. No followup recommended (ref: J Am Coll Radiol. 2015 Feb;12(2): 143-50). Lymph nodes: Mild adenitis on the left. Vascular: Negative. Limited intracranial: Negative. Visualized orbits: Postoperative globes on both sides. The left globe is small with thickened appearance of the sclera. Scleral band is present on the right. Mastoids and visualized paranasal sinuses: Negative Skeleton: Spondylosis.  Prior lower cervical laminectomy. Upper chest: Partially covered opacity in the superior segment left lower lobe. IMPRESSION: 1. Left submandibular sialoadenitis with prominent swelling that involves the submucosal space of the left pharynx and supraglottic larynx. There is no organized collection but there is gas tracking in the left parapharyngeal space. 2. Partially covered left lower lobe airspace disease, question aspiration pneumonia in this setting. Recommend chest x-ray follow-up. Electronically Signed   By: JoMonte Fantasia.D.   On: 07/12/2020 06:34   DG Chest Port 1 View  Result Date: 07/12/2020 CLINICAL DATA:  Cough and fever EXAM: PORTABLE CHEST 1 VIEW COMPARISON:  12/03/2018 FINDINGS: Chronic elevation of the left diaphragm. Mild streaky density  in the left lung. Clear right lung. No visible effusion or pneumothorax. Normal heart size. IMPRESSION: Patchy infiltrate on the left, question underlying aspiration given neck CT findings. Electronically Signed   By: Monte Fantasia M.D.   On: 07/12/2020 06:37      Subjective: Patient is non-verbal. No fevers.  Discharge Exam: Vitals:   07/16/20 2308 07/17/20 0305  BP: (!) 132/93   Pulse: 76 100  Resp: 20 20  Temp: 98  F (36.7 C) 98.3 F (36.8 C)  SpO2: 98% 96%   Vitals:   07/16/20 1553 07/16/20 2032 07/16/20 2308 07/17/20 0305  BP: (!) 165/107 (!) 148/84 (!) 132/93   Pulse: 94 89 76 100  Resp: (!) 23 (!) 22 20 20   Temp: 97.6 F (36.4 C) 98.1 F (36.7 C) 98 F (36.7 C) 98.3 F (36.8 C)  TempSrc: Oral Oral Oral Oral  SpO2: 98% 98% 98% 96%  Weight:      Height:        General: Pt is alert, awake, not in acute distress HEENT: Left submandibular mass Cardiovascular: RRR, S1/S2 +, no rubs, no gallops Respiratory: CTA bilaterally, no wheezing, no rhonchi Abdominal: Soft, NT, ND, bowel sounds + Extremities: no edema, no cyanosis    The results of significant diagnostics from this hospitalization (including imaging, microbiology, ancillary and laboratory) are listed below for reference.     Microbiology: Recent Results (from the past 240 hour(s))  Blood culture (routine x 2)     Status: None (Preliminary result)   Collection Time: 07/12/20  4:43 AM   Specimen: BLOOD  Result Value Ref Range Status   Specimen Description   Final    BLOOD LEFT ANTECUBITAL Performed at [facility], [address] [place], [place] [address]    Special Requests   Final    BOTTLES DRAWN AEROBIC AND ANAEROBIC Blood Culture adequate volume Performed at [facility], [address] [place], [place] [address]    Culture   Final    NO GROWTH 4 DAYS Performed at [facility], [place] [address] [place], [place] [address]    Report Status PENDING  Incomplete  Blood culture (routine x 2)     Status: None (Preliminary result)   Collection Time: 07/12/20  4:48 AM   Specimen: BLOOD  Result Value Ref Range Status   Specimen Description   Final    BLOOD RIGHT ANTECUBITAL Performed at [facility], [address] [place], [place] [address]    Special Requests   Final    BOTTLES DRAWN AEROBIC AND ANAEROBIC Blood Culture adequate volume Performed at [facility], [address] [place], [place] [address]    Culture   Final    NO GROWTH 4 DAYS Performed at [facility], [place] [address] [place],  [address]    Report Status PENDING  Incomplete  SARS Coronavirus 2 by RT PCR (hospital order, performed in [facility] hospital lab) Nasopharyngeal Nasopharyngeal Swab     Status: None   Collection Time: 07/12/20  5:27 AM   Specimen: Nasopharyngeal Swab  Result Value Ref Range Status   SARS Coronavirus 2 NEGATIVE NEGATIVE Final    Comment: (NOTE) SARS-CoV-2 target nucleic acids are NOT DETECTED.  The SARS-CoV-2 RNA is generally detectable in upper and lower respiratory specimens during the acute phase of infection. The lowest concentration of SARS-CoV-2 viral copies this assay can detect is 250 copies /  mL. A negative result does not preclude SARS-CoV-2 infection and should not be used as the sole basis for treatment or other patient management decisions.  A negative result may occur with improper specimen collection / handling, submission of specimen other than nasopharyngeal swab, presence of viral mutation(s) within the areas targeted by this assay, and inadequate number of viral copies (<250 copies / mL). A negative result must be combined with clinical observations, patient history, and epidemiological information.  Fact Sheet for Patients:   StrictlyIdeas.no  Fact Sheet for Healthcare Providers: BankingDealers.co.za  This test is not yet approved or  cleared by the [place] FDA and has been authorized for detection and/or diagnosis of SARS-CoV-2 by FDA under an Emergency Use Authorization (EUA).  This EUA will remain in effect (meaning this test can be used) for the duration of the COVID-19 declaration under Section 564(b)(1) of the Act, 21 U.S.C. section 360bbb-3(b)(1), unless the authorization is terminated or revoked sooner.  Performed at [facility], [address] [place], [place] [address]      Labs: BNP (last 3 results) No results for input(s): BNP in the last 8760 hours. Basic Metabolic Panel: Recent Labs  Lab 07/12/20 0443 07/14/20 0550 07/15/20 0215 07/16/20 0120  NA 138 142 137 134*  K 3.4* 3.2* 3.3* 3.3*  CL 100 104 104 100  CO2 23 27 19* 26  GLUCOSE 241* 161* 157* 233*  BUN 11 10 5* <5*  CREATININE 0.71 0.61 0.65 0.59  CALCIUM 9.2 8.6* 8.6* 8.9  MG  --   --  1.5* 1.9  PHOS  --   --   --  1.3*   Liver Function Tests: Recent Labs  Lab 07/12/20 0443  AST 24  ALT 25  ALKPHOS 65  BILITOT 0.8  PROT 7.6  ALBUMIN 4.4   No results for input(s): LIPASE, AMYLASE in the last 168 hours. No results for input(s): AMMONIA in the last 168 hours. CBC: Recent Labs  Lab 07/12/20 0443 07/15/20 0215 07/16/20 0120  WBC 20.0* 9.9 7.9  NEUTROABS 17.9*  --  5.4  HGB 12.4 10.4* 11.8*  HCT 38.2 32.4* 35.7*  MCV 87.4 88.0 83.8  PLT 340 276 351   Cardiac Enzymes: No results for input(s): CKTOTAL, CKMB, CKMBINDEX, TROPONINI in the last 168 hours. BNP: Invalid input(s): POCBNP CBG: Recent Labs  Lab 07/16/20 1516 07/16/20 1953 07/16/20 2307 07/17/20 0303 07/17/20 0848  GLUCAP 287* 289* 248* 198* 251*   D-Dimer No results for input(s): DDIMER in the last 72 hours. Hgb A1c Recent Labs    07/15/20 0214  HGBA1C 6.4*   Lipid Profile No results for input(s): CHOL, HDL, LDLCALC, TRIG, CHOLHDL, LDLDIRECT in the last 72 hours. Thyroid function studies No results for input(s): TSH, T4TOTAL, T3FREE, THYROIDAB in the last 72 hours.  Invalid input(s): FREET3 Anemia work up No results for input(s): VITAMINB12, FOLATE, FERRITIN, TIBC, IRON, RETICCTPCT in the last 72 hours. Urinalysis    Component Value Date/Time   COLORURINE YELLOW 07/12/2020 0720   APPEARANCEUR CLEAR 07/12/2020 0720   LABSPEC 1.010 07/12/2020 0720   PHURINE 5.5 07/12/2020 0720   GLUCOSEU 250 (A) 07/12/2020 0720   HGBUR NEGATIVE 07/12/2020 0720    BILIRUBINUR NEGATIVE 07/12/2020 0720   KETONESUR NEGATIVE 07/12/2020 0720   PROTEINUR NEGATIVE 07/12/2020 0720   UROBILINOGEN 1.0 07/13/2014 1240   NITRITE NEGATIVE 07/12/2020 0720   LEUKOCYTESUR NEGATIVE 07/12/2020 0720   Sepsis Labs Invalid input(s): PROCALCITONIN,  WBC,  LACTICIDVEN Microbiology Recent  Results (from the past 240 hour(s))  Blood culture (routine x 2)     Status: None (Preliminary result)   Collection Time: 07/12/20  4:43 AM   Specimen: BLOOD  Result Value Ref Range Status   Specimen Description   Final    BLOOD LEFT ANTECUBITAL Performed at [facility], [address] [place], [place] [address]    Special Requests   Final    BOTTLES DRAWN AEROBIC AND ANAEROBIC Blood Culture adequate volume Performed at [facility], [address] [place], [place] [address]    Culture   Final    NO GROWTH 4 DAYS Performed at [facility], [place] [address], [place], [place] [address]    Report Status PENDING  Incomplete  Blood culture (routine x 2)     Status: None (Preliminary result)   Collection Time: 07/12/20  4:48 AM   Specimen: BLOOD  Result Value Ref Range Status   Specimen Description   Final    BLOOD RIGHT ANTECUBITAL Performed at [facility], [address] [place], [place] [address]    Special Requests   Final    BOTTLES DRAWN AEROBIC AND ANAEROBIC Blood Culture adequate volume Performed at [facility], [address] [place], [place] [address]    Culture   Final    NO GROWTH 4 DAYS Performed at [facility], [place] [address], [place], [place] [address]    Report Status PENDING  Incomplete  SARS Coronavirus 2 by RT PCR (hospital order, performed in [facility] hospital lab) Nasopharyngeal Nasopharyngeal Swab     Status: None   Collection Time: 07/12/20  5:27 AM   Specimen: Nasopharyngeal Swab  Result Value Ref Range Status   SARS Coronavirus 2 NEGATIVE NEGATIVE Final    Comment: (NOTE) SARS-CoV-2  target nucleic acids are NOT DETECTED.  The SARS-CoV-2 RNA is generally detectable in upper and lower respiratory specimens during the acute phase of infection. The lowest concentration of SARS-CoV-2 viral copies this assay can detect is 250 copies / mL. A negative result does not preclude SARS-CoV-2 infection and should not be used as the sole basis for treatment or other patient management decisions.  A negative result may occur with improper specimen collection / handling, submission of specimen other than nasopharyngeal swab, presence of viral mutation(s) within the areas targeted by this assay, and inadequate number of viral copies (<250 copies / mL). A negative result must be combined with clinical observations, patient history, and epidemiological information.  Fact Sheet for Patients:   StrictlyIdeas.no  Fact Sheet for Healthcare Providers: BankingDealers.co.za  This test is not yet approved or  cleared by the [place] FDA and has been authorized for detection and/or diagnosis of SARS-CoV-2 by FDA under an Emergency Use Authorization (EUA).  This EUA will remain in effect (meaning this test can be used) for the duration of the COVID-19 declaration under Section 564(b)(1) of the Act, 21 U.S.C. section 360bbb-3(b)(1), unless the authorization is terminated or revoked sooner.  Performed at [facility], [address], [place], [place] [address]      Time coordinating discharge: 35 minutes  SIGNED:   Cordelia Poche, MD [facility] 07/17/2020, 12:07 PM

## 2020-07-17 NOTE — Evaluation (Signed)
Physical Therapy Evaluation Patient Details Name: Jocelyn Simmons MRN: [id number] DOB: Mar 28, 1961 Today's Date: 07/17/2020   History of Present Illness  Jocelyn Simmons is a 59 y.o. female with history of insulin-dependent diabetes, hypertension, hyperlipidemia, anxiety depression and intellectual disability. Patient presented secondary to left facial swelling and found to have sialoadenitis with evidence of sepsis and aspiration pneumonia.   Clinical Impression  Patient evaluated by Physical Therapy with no further acute PT needs identified. All education has been completed and the patient has no further questions. Pt able to ambulate throughout room with HHA and/or hand on IV pole to guide her due to visual and cognitive deficits. Seems to be close to pt's baseline from what RN relays from pt's sister. Pt appropriate for return home with sister when medically cleared. No f/u PT recs as expect that pt's mobility will improve with performing her normal daily tasks.  See below for any follow-up Physical Therapy or equipment needs. PT is signing off. Thank you for this referral.     Follow Up Recommendations No PT follow up    Equipment Recommendations  None recommended by PT    Recommendations for Other Services       Precautions / Restrictions Precautions Precautions: None Restrictions Weight Bearing Restrictions: No      Mobility  Bed Mobility Overal bed mobility: Needs Assistance Bed Mobility: Supine to Sit     Supine to sit: Min assist     General bed mobility comments: came to EOB with verbal and visual cues, HHA given  Transfers Overall transfer level: Needs assistance Equipment used: None Transfers: Sit to/from Stand Sit to Stand: Min guard         General transfer comment: min-guard A for safety, no LOB noted  Ambulation/Gait Ambulation/Gait assistance: Min assist Gait Distance (Feet): 25 Feet Assistive device: IV Pole Gait Pattern/deviations: Step-through  pattern Gait velocity: decreased Gait velocity interpretation: 1.31 - 2.62 ft/sec, indicative of limited community ambulator General Gait Details: pt could not let me know whether or not she uses a RW but she let go of it when it was placed in front of her so did not use it to ambulate. 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 Pt steady with gait and did not show signs of increased fatigue. VSS  Stairs            Wheelchair Mobility    Modified Rankin (Stroke Patients Only)       Balance Overall balance assessment: Mild deficits observed, not formally tested                                           Pertinent Vitals/Pain Pain Assessment: Faces Faces Pain Scale: No hurt    Home Living Family/patient expects to be discharged to:: Private residence Living Arrangements: Other relatives Available Help at Discharge: Family;Available 24 hours/day             Additional Comments: pt lives with her sister whome RN has spoken with and feels comfortable taking pt home. Pt unable to give any info    Prior Function Level of Independence: Needs assistance   Gait / Transfers Assistance Needed: was ambulatory  ADL's / Homemaking Assistance Needed: unknown        Hand Dominance        Extremity/Trunk Assessment  Upper Extremity Assessment Upper Extremity Assessment: Overall WFL for tasks assessed    Lower Extremity Assessment Lower Extremity Assessment: Overall WFL for tasks assessed    Cervical / Trunk Assessment Cervical / Trunk Assessment: Normal  Communication   Communication: Expressive difficulties;Receptive difficulties  Cognition Arousal/Alertness: Awake/alert Behavior During Therapy: WFL for tasks assessed/performed;Flat affect Overall Cognitive Status: History of cognitive impairments - at baseline                                 General Comments: pt sometimes  nodded head, did not verbalize coherently, followed simple commands >50% of time      General Comments General comments (skin integrity, edema, etc.): pt appears to be at or close to baseline LOF    Exercises     Assessment/Plan    PT Assessment Patent does not need any further PT services  PT Problem List Decreased mobility       PT Treatment Interventions Gait training    PT Goals (Current goals can be found in the Care Plan section)  Acute Rehab PT Goals Patient Stated Goal: none stated PT Goal Formulation: All assessment and education complete, DC therapy    Frequency     Barriers to discharge        Co-evaluation               AM-PAC PT "6 Clicks" Mobility  Outcome Measure Help needed turning from your back to your side while in a flat bed without using bedrails?: None Help needed moving from lying on your back to sitting on the side of a flat bed without using bedrails?: None Help needed moving to and from a bed to a chair (including a wheelchair)?: A Little Help needed standing up from a chair using your arms (e.g., wheelchair or bedside chair)?: A Little Help needed to walk in hospital room?: A Little Help needed climbing 3-5 steps with a railing? : A Little 6 Click Score: 20    End of Session Equipment Utilized During Treatment: Gait belt Activity Tolerance: Patient tolerated treatment well Patient left: in chair;with call bell/phone within reach;with chair alarm set Nurse Communication: Mobility status PT Visit Diagnosis: Unsteadiness on feet (R26.81)    Time: 9702-6378 PT Time Calculation (min) (ACUTE ONLY): 18 min   Charges:   PT Evaluation $PT Eval Moderate Complexity: 1 Mod          Jocelyn Simmons, PT  Acute Rehab Services  Pager [phone] Office [phone]   Jocelyn Simmons 07/17/2020, 1:40 PM

## 2020-07-17 NOTE — Progress Notes (Signed)
Subjective: No issues overnight.  Resting comfortably in bed.  Objective: Vital signs in last 24 hours: Temp:  [97.6 F (36.4 C)-98.3 F (36.8 C)] 98.3 F (36.8 C) (08/21 0305) Pulse Rate:  [76-100] 100 (08/21 0305) Resp:  [11-23] 20 (08/21 0305) BP: (132-182)/(82-108) 132/93 (08/20 2308) SpO2:  [96 %-98 %] 96 % (08/21 0305)  Physical Exam: General appearance: alert and cooperative. Nonverbal. Head: Normocephalic, without obvious abnormality, atraumatic Ears: Examination of the ears shows normal auricles and external auditory canals bilaterally. Nose: Nasal examination shows normal mucosa, septum, turbinates.  Face: Facial examination shows no asymmetry. Palpation of the face elicit no significant tenderness.  Mouth: Oral cavity examination shows no mucosal edema or erythema. Floor of mouth is soft to palpation. Neck: Moderate induration of the left submandibular gland is noted.  No skin erythema.  Recent Labs    07/15/20 0215 07/16/20 0120  WBC 9.9 7.9  HGB 10.4* 11.8*  HCT 32.4* 35.7*  PLT 276 351   Recent Labs    07/15/20 0215 07/16/20 0120  NA 137 134*  K 3.3* 3.3*  CL 104 100  CO2 19* 26  GLUCOSE 157* 233*  BUN 5* <5*  CREATININE 0.65 0.59  CALCIUM 8.6* 8.9    Medications:  I have reviewed the patient's current medications. Scheduled: . amLODipine  10 mg Oral Daily  . ARIPiprazole  5 mg Oral Daily  . busPIRone  15 mg Oral BID  . Dextromethorphan-quiNIDine  1 capsule Oral BID  . enoxaparin (LOVENOX) injection  40 mg Subcutaneous Q24H  . insulin aspart  0-9 Units Subcutaneous Q4H  . pantoprazole  40 mg Oral Daily  . PARoxetine  40 mg Oral QHS  . phosphorus  500 mg Oral QID  . sodium chloride flush  3 mL Intravenous Q12H  . traZODone  50-100 mg Oral QHS   Continuous: . ceFEPime (MAXIPIME) IV 2 g (07/17/20 0914)  . metronidazole 500 mg (07/17/20 0615)    Assessment/Plan: The patient's submandibular sialadenitis has significantly improved.  Neck CTs  reviewed. - The small parapharyngeal hypodensity (possible abscess) likely represents maturation of her infection. - With the normal WBC and the clinical improvement, may d/c home on oral clindamycin (300mg  QID for 10days). - She may follow up with me as an outpatient in 1 week.   LOS: 3 days   Jocelyn Simmons 07/17/2020, 10:34 AM

## 2020-07-17 NOTE — Progress Notes (Signed)
Nsg Discharge Note  Admit Date:  07/12/2020 Discharge date: 07/17/2020   Julanne Schlueter to be D/C'd home per MD order.  AVS completed. Patient/caregiver able to verbalize understanding.  Discharge Medication: Allergies as of 07/17/2020   No Known Allergies     Medication List    TAKE these medications   amLODipine 5 MG tablet Commonly known as: NORVASC Take 10 mg by mouth daily.   amoxicillin-clavulanate 875-125 MG tablet Commonly known as: Augmentin Take 1 tablet by mouth 2 (two) times daily for 10 days.   ARIPiprazole 5 MG tablet Commonly known as: ABILIFY Take 5 mg by mouth See admin instructions. Take 2.59m daily, May increase to 584mdaily as needed for mood control.   atorvastatin 20 MG tablet Commonly known as: LIPITOR Take 20 mg by mouth at bedtime.   blood glucose meter kit and supplies Dispense based on patient and insurance preference. Use up to four times daily as directed. (FOR ICD-9 250.00, 250.01).   busPIRone 15 MG tablet Commonly known as: BUSPAR Take 15 mg by mouth 2 (two) times daily.   HumaLOG KwikPen 100 UNIT/ML KwikPen Generic drug: insulin lispro Inject 0-10 Units into the skin See admin instructions. Sliding Scale If BS is 0-100= 0 units, 100-200=4 units 201-300=6units 301-350=8 units  >350=10 units   lisinopril 5 MG tablet Commonly known as: ZESTRIL Take 5 mg by mouth daily.   LORazepam 1 MG tablet Commonly known as: ATIVAN Take 0.5-1 mg by mouth 2 (two) times daily as needed for anxiety (extreme agitation).   metFORMIN 1000 MG tablet Commonly known as: Glucophage Take 1 tablet (1,000 mg total) by mouth 2 (two) times daily with a meal.   Nuedexta 20-10 MG capsule Generic drug: Dextromethorphan-quiNIDine Take 1 capsule by mouth 2 (two) times daily.   pantoprazole 40 MG tablet Commonly known as: PROTONIX Take 40 mg by mouth daily.   PARoxetine 40 MG tablet Commonly known as: PAXIL Take 40 mg by mouth at bedtime.   sitaGLIPtin 50 MG  tablet Commonly known as: JANUVIA Take 50 mg by mouth daily.   traZODone 100 MG tablet Commonly known as: DESYREL Take 50-100 mg by mouth at bedtime. For Unsteady gait/dizziness       Discharge Assessment: Vitals:   07/16/20 2308 07/17/20 0305  BP: (!) 132/93   Pulse: 76 100  Resp: 20 20  Temp: 98 F (36.7 C) 98.3 F (36.8 C)  SpO2: 98% 96%   Skin clean, dry and intact without evidence of skin break down, no evidence of skin tears noted. IV catheter discontinued intact. Site without signs and symptoms of complications - no redness or edema noted at insertion site, patient denies c/o pain - only slight tenderness at site.  Dressing with slight pressure applied.  D/c Instructions-Education: Discharge instructions given to patient/family with verbalized understanding. D/c education completed with patient/family including follow up instructions, medication list, d/c activities limitations if indicated, with other d/c instructions as indicated by MD - patient able to verbalize understanding, all questions fully answered. Patient instructed to return to ED, call 911, or call MD for any changes in condition.  [place] [facility] [place] via private auto.  AmAtilano InaRN 07/17/2020 12:45 PM

## 2021-12-26 IMAGING — CT CT NECK W/ CM
3 of 5 series · 13 of 33 positions shown, 16 images · IV contrast (APPLIED)
Comparison: 07/12/2020

CLINICAL DATA: Left neck swelling.  Elevated white count.

EXAM:
CT NECK WITH CONTRAST
TECHNIQUE: Multidetector CT imaging of the neck was performed using the
standard protocol following the bolus administration of intravenous
contrast.
CONTRAST:  75mL OMNIPAQUE IOHEXOL 300 MG/ML  SOLN

[Series 7: coronal st · coronal · 0.34mm/px · 3 of 135 slices shown]
[im 27/135  bone]
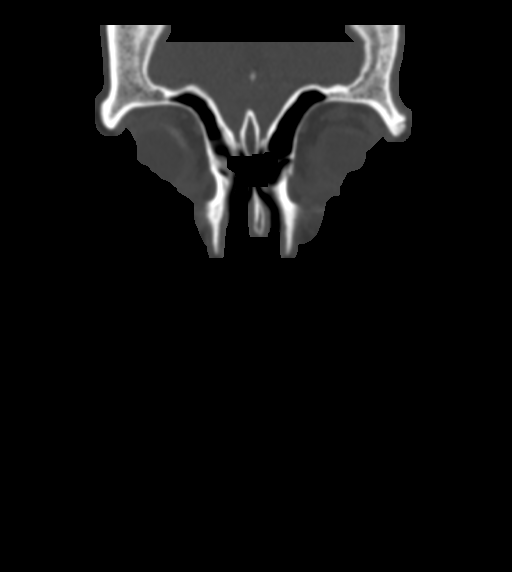
[im 54/135  bone]
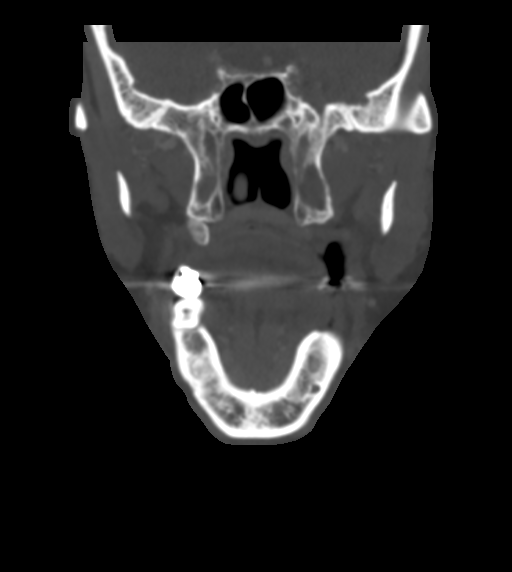
[im 81/135  bone]
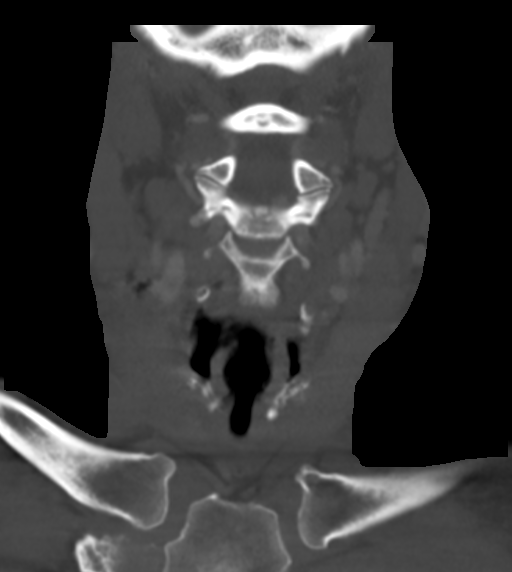

[Series 8: sagittal st · sagittal · 0.40mm/px · 5 of 101 slices shown, 6 images]
[im 34/101  bone]
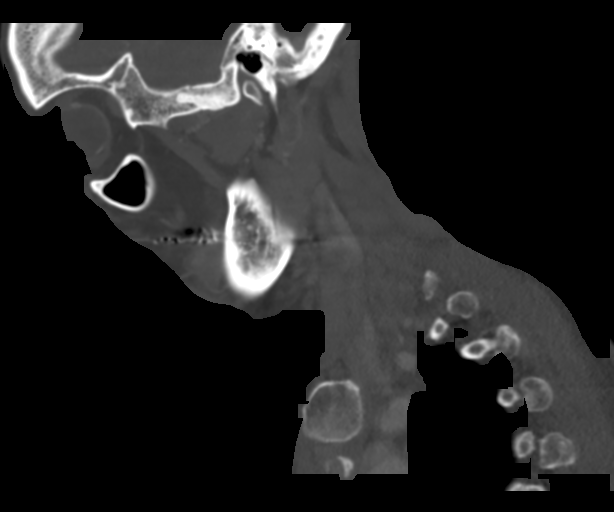
[im 42/101  bone]
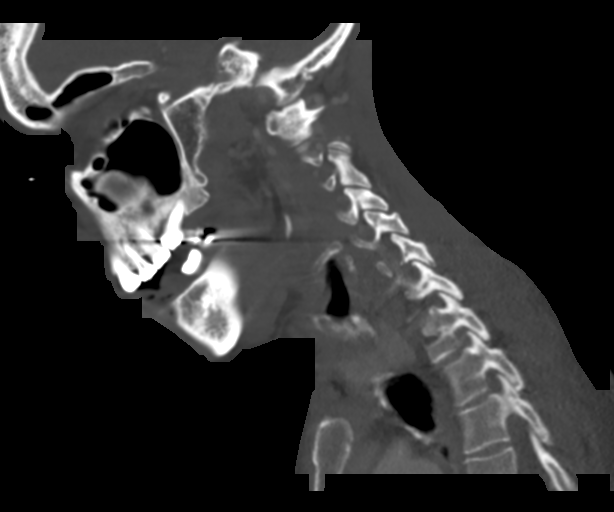
[im 51/101  soft-tissue]
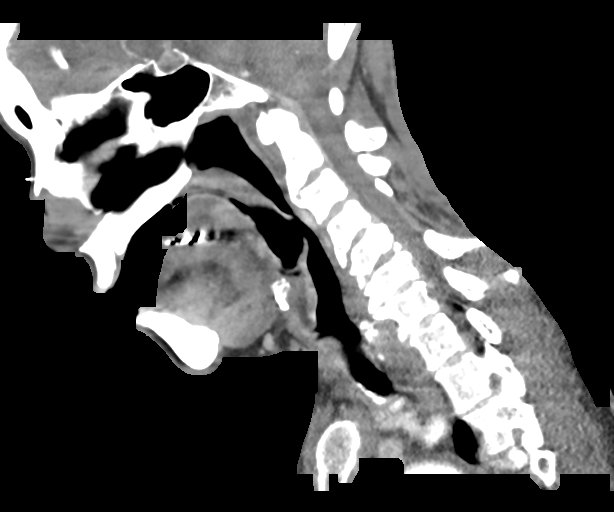
[im 51/101  bone]
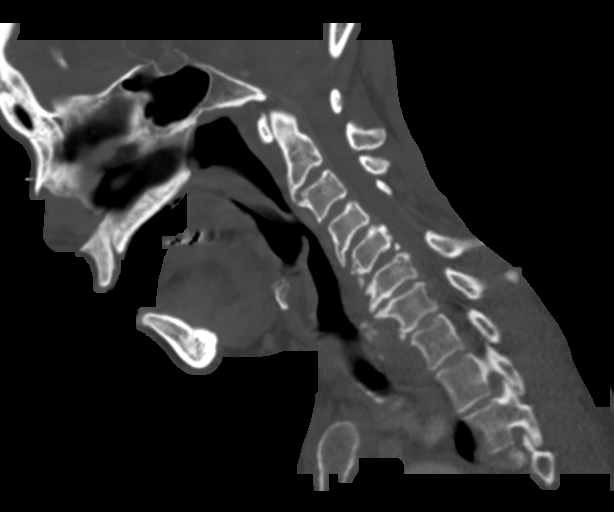
[im 59/101  bone]
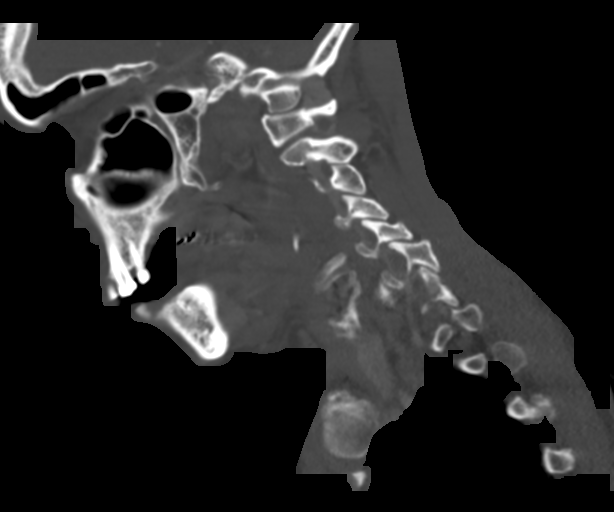
[im 67/101  bone]
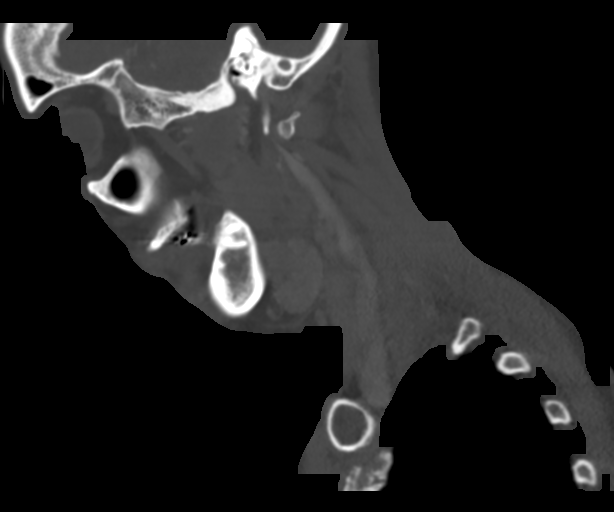

[Series 9: orthogonal st · axial · 0.39mm/px · z∈[-307,-162]mm · 5 of 127 slices shown, 7 images]
[im 22/127  soft-tissue]
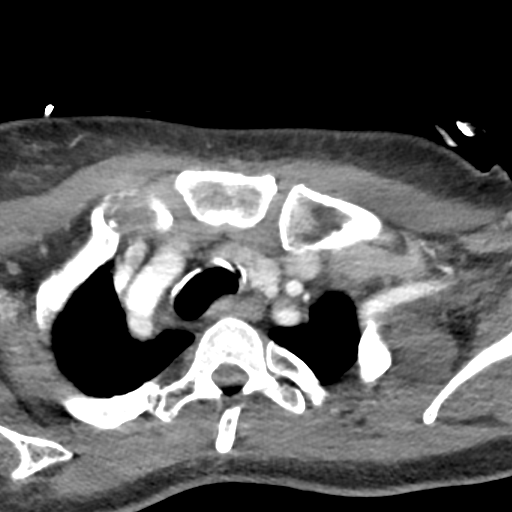
[im 22/127  bone]
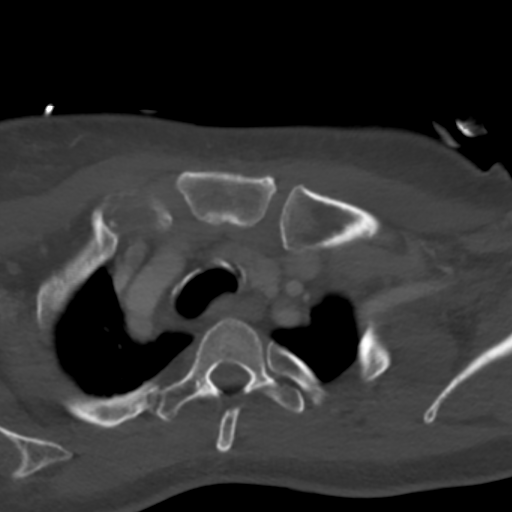
[im 43/127  bone]
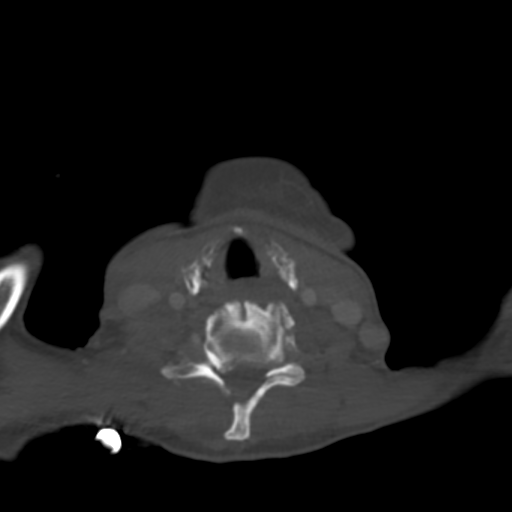
[im 64/127  bone]
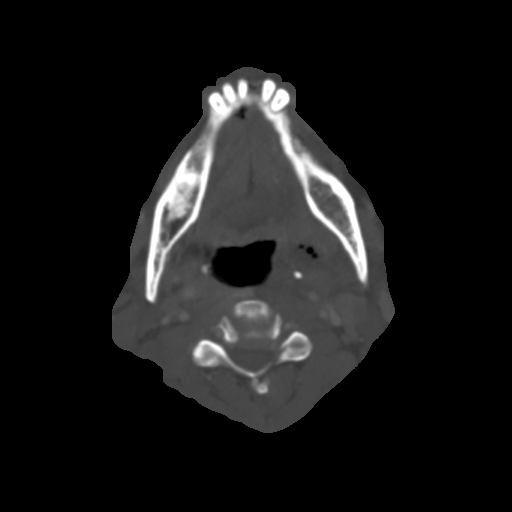
[im 85/127  bone]
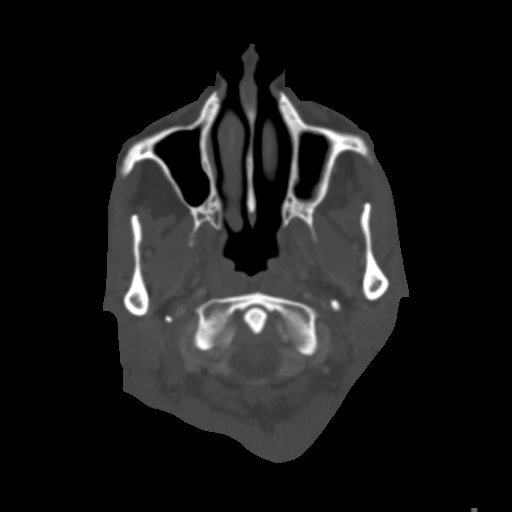
[im 106/127  soft-tissue]
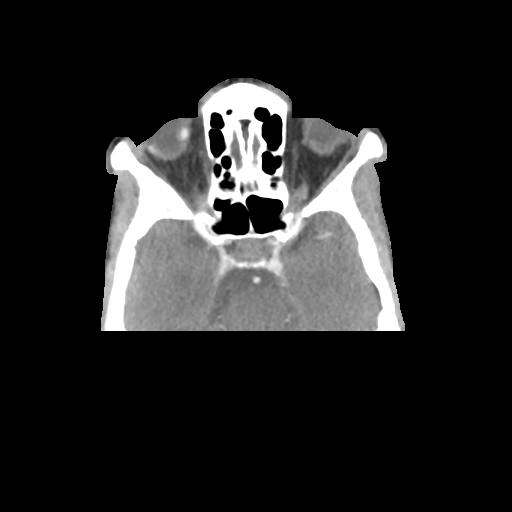
[im 106/127  bone]
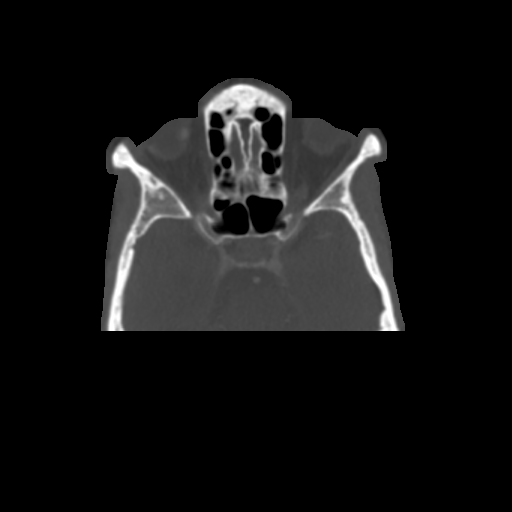

[13 of 33 positions shown; findings below may reference images not displayed]

FINDINGS: Pharynx and larynx: There is less edema and swelling of the left
submandibular gland and less edema in the surrounding submandibular
space. Persistent evidence of inflammatory disease in the left
parapharyngeal space with a 2 cm abscess at the level of the angle
of the mandible. Smaller abscess superior to that at the level of
the oropharynx, measuring about 1 cm.

Salivary glands: Parotid glands are normal. Right submandibular
gland is normal. As noted above, there is diminishing inflammation
of the left submandibular gland and surrounding soft tissues.

Thyroid: 13 mm thyroid nodule on the right.

Lymph nodes: Mild reactive enlargement of level 2 nodes on the left
without suppuration.

Vascular: Negative

Limited intracranial: Normal

Visualized orbits: Negative

Mastoids and visualized paranasal sinuses: Clear

Skeleton: Pronounced cervical spondylosis. Previous posterior
decompression C4 and C5.

Upper chest: Newly seen patchy pulmonary infiltrates in the right
upper lobe consistent with bronchopneumonia. Left upper lobe is now
clear.

Other: None
IMPRESSION: Decrease in inflammatory change of the left submandibular gland
itself and of the surrounding soft tissues. Development of a 2 cm
parapharyngeal abscess on the left at the level of the angle of the
mandible. Smaller 1 cm left parapharyngeal abscess at the level of
the oropharynx.

Newly seen patchy infiltrates in the right upper lobe consistent
with bronchopneumonia.

13 mm thyroid nodule on the right. Not clinically significant; no
follow-up imaging recommended (ref: [HOSPITAL]. 0789

## 2023-04-03 ENCOUNTER — Emergency Department (HOSPITAL_BASED_OUTPATIENT_CLINIC_OR_DEPARTMENT_OTHER): Payer: 59

## 2023-04-03 ENCOUNTER — Emergency Department (HOSPITAL_BASED_OUTPATIENT_CLINIC_OR_DEPARTMENT_OTHER)
Admission: EM | Admit: 2023-04-03 | Discharge: 2023-04-03 | Disposition: A | Discharge status: Home / Self Care | Payer: 59 | Attending: Emergency Medicine | Admitting: Emergency Medicine

## 2023-04-03 ENCOUNTER — Other Ambulatory Visit: Payer: Self-pay

## 2023-04-03 ENCOUNTER — Encounter (HOSPITAL_BASED_OUTPATIENT_CLINIC_OR_DEPARTMENT_OTHER): Payer: Self-pay | Admitting: Emergency Medicine

## 2023-04-03 DIAGNOSIS — Z7984 Long term (current) use of oral hypoglycemic drugs: Secondary | ICD-10-CM | POA: Diagnosis not present

## 2023-04-03 DIAGNOSIS — S62307D Unspecified fracture of fifth metacarpal bone, left hand, subsequent encounter for fracture with routine healing: Secondary | ICD-10-CM

## 2023-04-03 DIAGNOSIS — M7989 Other specified soft tissue disorders: Secondary | ICD-10-CM | POA: Diagnosis not present

## 2023-04-03 DIAGNOSIS — M25532 Pain in left wrist: Secondary | ICD-10-CM | POA: Diagnosis present

## 2023-04-03 DIAGNOSIS — I10 Essential (primary) hypertension: Secondary | ICD-10-CM | POA: Diagnosis not present

## 2023-04-03 DIAGNOSIS — S62367D Nondisplaced fracture of neck of fifth metacarpal bone, left hand, subsequent encounter for fracture with routine healing: Secondary | ICD-10-CM | POA: Insufficient documentation

## 2023-04-03 DIAGNOSIS — W19XXXD Unspecified fall, subsequent encounter: Secondary | ICD-10-CM | POA: Insufficient documentation

## 2023-04-03 DIAGNOSIS — E119 Type 2 diabetes mellitus without complications: Secondary | ICD-10-CM | POA: Diagnosis not present

## 2023-04-03 NOTE — ED Triage Notes (Signed)
Pt with sister who is her legal guardian , reports had fall on Sunday 3 days ago , chin laceration , no bleeding , left wrist pain and limited ROM per sister .

## 2023-04-03 NOTE — ED Provider Notes (Signed)
[place] [facility] Provider Note   CSN: [id number] Arrival date & time: 04/03/23  0813     History  Chief Complaint  Patient presents with   Wrist Pain    Jocelyn Simmons is a 62 y.o. female.  Patient is a 62 year old female with developmental delay, hypertension and diabetes who is presenting today with her caregiver because of concern that she may have injured her left wrist and her right foot has been intermittently swollen.  Her caregiver reports that she has a hard time going to sleep and on Sunday afternoon at some point she was in her room and must of fallen because there was a cut on her chin.  She had noticed she was not using her left arm quite as much and her finger seemed a little bit swollen.  She has not seemed to be in any pain but she just wanted to get things checked out.  She also noticed some intermittent swelling of her right foot but patient is still been able to walk.  The history is provided by a caregiver.  Wrist Pain       Home Medications Prior to Admission medications   Medication Sig Start Date End Date Taking? Authorizing Provider  amLODipine (NORVASC) 5 MG tablet Take 10 mg by mouth daily.     [provider]  ARIPiprazole (ABILIFY) 5 MG tablet Take 5 mg by mouth See admin instructions. Take 2.5mg  daily, May increase to 5mg  daily as needed for mood control.    [provider]  atorvastatin (LIPITOR) 20 MG tablet Take 20 mg by mouth at bedtime.     [provider]  blood glucose meter kit and supplies Dispense based on patient and insurance preference. Use up to four times daily as directed. (FOR ICD-9 250.00, 250.01). 08/21/16   Randel Pigg, Dorma Russell, MD  busPIRone (BUSPAR) 15 MG tablet Take 15 mg by mouth 2 (two) times daily.    [provider]  HUMALOG KWIKPEN 100 UNIT/ML KwikPen Inject 0-10 Units into the skin See admin instructions. Sliding Scale If BS is 0-100= 0 units, 100-200=4  units 201-300=6units 301-350=8 units  >350=10 units 06/14/20   [provider]  lisinopril (ZESTRIL) 5 MG tablet Take 5 mg by mouth daily.    [provider]  LORazepam (ATIVAN) 1 MG tablet Take 0.5-1 mg by mouth 2 (two) times daily as needed for anxiety (extreme agitation).     [provider]  metFORMIN (GLUCOPHAGE) 1000 MG tablet Take 1 tablet (1,000 mg total) by mouth 2 (two) times daily with a meal. 08/21/16   Randel Pigg, Dorma Russell, MD  NUEDEXTA 20-10 MG capsule Take 1 capsule by mouth 2 (two) times daily. 06/25/20   [provider]  pantoprazole (PROTONIX) 40 MG tablet Take 40 mg by mouth daily.    [provider]  PARoxetine (PAXIL) 40 MG tablet Take 40 mg by mouth at bedtime.     [provider]  sitaGLIPtin (JANUVIA) 50 MG tablet Take 50 mg by mouth daily.    [provider]  traZODone (DESYREL) 100 MG tablet Take 50-100 mg by mouth at bedtime. For Unsteady gait/dizziness    [provider]      Allergies    Patient has no known allergies.    Review of Systems   Review of Systems  Physical Exam Updated Vital Signs BP (!) 167/105   Pulse 100   Resp 18   LMP  (LMP Unknown)  SpO2 92%  Physical Exam Vitals and nursing note reviewed.  Constitutional:      General: She is not in acute distress.    Appearance: She is normal weight.  Eyes:     Comments: Left eye opacity  Cardiovascular:     Rate and Rhythm: Normal rate.  Pulmonary:     Effort: Pulmonary effort is normal.  Musculoskeletal:        General: Swelling present.     Comments: Swelling mid noted to the mid right foot but no notable tenderness.  Full range of motion at the ankle.  Left wrist without deformity or focal pain.  Patient will flex and extend the wrist.  She will grip on both sides.  Neurological:     Mental Status: She is alert. Mental status is at baseline.  Psychiatric:     Comments: Cooperative     ED Results / Procedures /  Treatments   Labs (all labs ordered are listed, but only abnormal results are displayed) Labs Reviewed - No data to display  EKG None  Radiology DG Wrist Complete Left  Result Date: 04/03/2023 CLINICAL DATA:  Left wrist injury, limited range of motion EXAM: LEFT WRIST - COMPLETE 3+ VIEW COMPARISON:  None Available. FINDINGS: Normal alignment.  Distal radius, ulna and carpal bones intact. Of note, there is a healing oblique nondisplaced fracture of the left fifth metacarpal neck with callus formation. Fracture line remains visible. No focal soft tissue abnormality. IMPRESSION: Healing nondisplaced left fifth metacarpal neck fracture. No acute finding at the wrist. Electronically Signed   By: Judie Petit.  Shick M.D.   On: 04/03/2023 09:01   DG Foot Complete Right  Result Date: 04/03/2023 CLINICAL DATA:  Swelling subsequent to a fall 3 days ago. EXAM: RIGHT FOOT COMPLETE - 3+ VIEW COMPARISON:  None Available. FINDINGS: There is no evidence of fracture or dislocation. There is no evidence of arthropathy or other focal bone abnormality. Soft tissues are unremarkable. IMPRESSION: Negative. Electronically Signed   By: Paulina Fusi M.D.   On: 04/03/2023 08:56    Procedures Procedures    Medications Ordered in ED Medications - No data to display  ED Course/ Medical Decision Making/ A&P                             Medical Decision Making Amount and/or Complexity of Data Reviewed Radiology: ordered and independent interpretation performed. Decision-making details documented in ED Course.   Patient presenting today due to concern that she may have injured her hand and her right foot from a fall that was not witnessed by her caregiver.  Patient has still been using both but she was concerned because she had noticed some swelling.  Patient otherwise has been acting appropriately.  On exam some swelling is noted to the right foot.  I have independently visualized and interpreted pt's images today.  Left wrist  shows a 5th distal metacarpal fracture which radiology reports is healing and callus is present.  Right foot is normal.  Since it appears the bone is healing do not feel that patient needs any bracing at this time.  Findings discussed with patient's caregiver.  At this time patient stable for discharge.         Final Clinical Impression(s) / ED Diagnoses Final diagnoses:  Closed fracture of fifth metacarpal bone of left hand with routine healing, unspecified fracture morphology, subsequent encounter    Rx / DC Orders ED Discharge  Orders     None         Gwyneth Sprout, MD 04/03/23 [phone]
